# Patient Record
Sex: Male | Born: 1975
Health system: Southern US, Community
[De-identification: ages and names within clinical notes are randomized; demographics above are authoritative.]

## PROBLEM LIST (undated history)

## (undated) DIAGNOSIS — I10 Essential (primary) hypertension: Secondary | ICD-10-CM

## (undated) DIAGNOSIS — C44319 Basal cell carcinoma of skin of other parts of face: Secondary | ICD-10-CM

## (undated) HISTORY — DX: Essential (primary) hypertension: I10

## (undated) HISTORY — DX: Basal cell carcinoma of skin of other parts of face: C44.319

---
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
## 2007-04-14 HISTORY — PX: APPENDECTOMY: SHX54

## 2013-04-12 ENCOUNTER — Ambulatory Visit: Payer: Self-pay | Admitting: Internal Medicine

## 2013-04-14 ENCOUNTER — Encounter: Payer: Self-pay | Admitting: Internal Medicine

## 2013-04-14 ENCOUNTER — Ambulatory Visit: Payer: Self-pay | Admitting: Internal Medicine

## 2013-04-14 ENCOUNTER — Telehealth: Payer: Self-pay

## 2013-04-14 ENCOUNTER — Ambulatory Visit (INDEPENDENT_AMBULATORY_CARE_PROVIDER_SITE_OTHER): Payer: BC Managed Care – PPO | Admitting: Internal Medicine

## 2013-04-14 VITALS — BP 140/88 | HR 61 | Temp 98.1°F | Ht 68.0 in | Wt 185.0 lb

## 2013-04-14 DIAGNOSIS — Z0000 Encounter for general adult medical examination without abnormal findings: Secondary | ICD-10-CM

## 2013-04-14 DIAGNOSIS — G4700 Insomnia, unspecified: Secondary | ICD-10-CM

## 2013-04-14 MED ORDER — ZOLPIDEM TARTRATE 5 MG PO TABS: 5.0000 mg | ORAL_TABLET | Freq: Every evening | ORAL | Status: DC | PRN

## 2013-04-14 NOTE — Telephone Encounter (Signed)
Called in Rx for Ambien as directed by Webb Silversmith

## 2013-04-14 NOTE — Progress Notes (Signed)
Pre-visit discussion using our clinic review tool. No additional management support is needed unless otherwise documented below in the visit note.  

## 2013-04-14 NOTE — Patient Instructions (Signed)
Health Maintenance, Males A healthy lifestyle and preventative care can promote health and wellness.  Maintain regular health, dental, and eye exams.  Eat a healthy diet. Foods like vegetables, fruits, whole grains, low-fat dairy products, and lean protein foods contain the nutrients you need without too many calories. Decrease your intake of foods high in solid fats, added sugars, and salt. Get information about a proper diet from your caregiver, if necessary.  Regular physical exercise is one of the most important things you can do for your health. Most adults should get at least 150 minutes of moderate-intensity exercise (any activity that increases your heart rate and causes you to sweat) each week. In addition, most adults need muscle-strengthening exercises on 2 or more days a week.   Maintain a healthy weight. The body mass index (BMI) is a screening tool to identify possible weight problems. It provides an estimate of body fat based on height and weight. Your caregiver can help determine your BMI, and can help you achieve or maintain a healthy weight. For adults 20 years and older:  A BMI below 18.5 is considered underweight.  A BMI of 18.5 to 24.9 is normal.  A BMI of 25 to 29.9 is considered overweight.  A BMI of 30 and above is considered obese.  Maintain normal blood lipids and cholesterol by exercising and minimizing your intake of saturated fat. Eat a balanced diet with plenty of fruits and vegetables. Blood tests for lipids and cholesterol should begin at age 84 and be repeated every 5 years. If your lipid or cholesterol levels are high, you are over 50, or you are a high risk for heart disease, you may need your cholesterol levels checked more frequently.Ongoing high lipid and cholesterol levels should be treated with medicines, if diet and exercise are not effective.  If you smoke, find out from your caregiver how to quit. If you do not use tobacco, do not start.  Lung  cancer screening is recommended for adults aged 62 80 years who are at high risk for developing lung cancer because of a history of smoking. Yearly low-dose computed tomography (CT) is recommended for people who have at least a 30-pack-year history of smoking and are a current smoker or have quit within the past 15 years. A pack year of smoking is smoking an average of 1 pack of cigarettes a day for 1 year (for example: 1 pack a day for 30 years or 2 packs a day for 15 years). Yearly screening should continue until the smoker has stopped smoking for at least 15 years. Yearly screening should also be stopped for people who develop a health problem that would prevent them from having lung cancer treatment.  If you choose to drink alcohol, do not exceed 2 drinks per day. One drink is considered to be 12 ounces (355 mL) of beer, 5 ounces (148 mL) of wine, or 1.5 ounces (44 mL) of liquor.  Avoid use of street drugs. Do not share needles with anyone. Ask for help if you need support or instructions about stopping the use of drugs.  High blood pressure causes heart disease and increases the risk of stroke. Blood pressure should be checked at least every 1 to 2 years. Ongoing high blood pressure should be treated with medicines if weight loss and exercise are not effective.  If you are 32 to 39 years old, ask your caregiver if you should take aspirin to prevent heart disease.  Diabetes screening involves taking a blood  sample to check your fasting blood sugar level. This should be done once every 3 years, after age 80, if you are within normal weight and without risk factors for diabetes. Testing should be considered at a younger age or be carried out more frequently if you are overweight and have at least 1 risk factor for diabetes.  Colorectal cancer can be detected and often prevented. Most routine colorectal cancer screening begins at the age of 72 and continues through age 62. However, your caregiver may  recommend screening at an earlier age if you have risk factors for colon cancer. On a yearly basis, your caregiver may provide home test kits to check for hidden blood in the stool. Use of a small camera at the end of a tube, to directly examine the colon (sigmoidoscopy or colonoscopy), can detect the earliest forms of colorectal cancer. Talk to your caregiver about this at age 14, when routine screening begins. Direct examination of the colon should be repeated every 5 to 10 years through age 100, unless early forms of pre-cancerous polyps or small growths are found.  Hepatitis C blood testing is recommended for all people born from [birth year] through 1966 and any individual with known risks for hepatitis C.  Healthy men should no longer receive prostate-specific antigen (PSA) blood tests as part of routine cancer screening. Consult with your caregiver about prostate cancer screening.  Testicular cancer screening is not recommended for adolescents or adult males who have no symptoms. Screening includes self-exam, caregiver exam, and other screening tests. Consult with your caregiver about any symptoms you have or any concerns you have about testicular cancer.  Practice safe sex. Use condoms and avoid high-risk sexual practices to reduce the spread of sexually transmitted infections (STIs).  Use sunscreen. Apply sunscreen liberally and repeatedly throughout the day. You should seek shade when your shadow is shorter than you. Protect yourself by wearing long sleeves, pants, a wide-brimmed hat, and sunglasses year round, whenever you are outdoors.  Notify your caregiver of new moles or changes in moles, especially if there is a change in shape or color. Also notify your caregiver if a mole is larger than the size of a pencil eraser.  A one-time screening for abdominal aortic aneurysm (AAA) and surgical repair of large AAAs by sound wave imaging (ultrasonography) is recommended for ages 8 to 68 years who are  current or former smokers.  Stay current with your immunizations. Document Released: 09/26/2007 Document Revised: 07/25/2012 Document Reviewed: 08/25/2010 [facility] Patient Information 2014 [place], [place].

## 2013-04-14 NOTE — Progress Notes (Signed)
HPI  Pt presents to the clinic today to establish care. He recently moved from [place]. He has no concerns today.  Flu: 2014 Tetanus: 2012 Dentist: biannually  Past Medical History  Diagnosis Date  . Hypertension     Current Outpatient Prescriptions  Medication Sig Dispense Refill  . lisinopril-hydrochlorothiazide (PRINZIDE,ZESTORETIC) 20-12.5 MG per tablet Take 1 tablet by mouth daily.      . Multiple Vitamin (MULTIVITAMIN) tablet Take 1 tablet by mouth daily.      Marland Kitchen zolpidem (AMBIEN) 5 MG tablet Take 5 mg by mouth at bedtime as needed for sleep.       No current facility-administered medications for this visit.    No Known Allergies  Family History  Problem Relation Age of Onset  . Hypertension Mother   . Hypertension Father   . Hypertension Maternal Grandmother   . Hypertension Maternal Grandfather   . Cancer Maternal Grandfather   . Hypertension Paternal Grandmother   . Hypertension Paternal Grandfather   . Cancer Paternal Grandfather   . Hypertension Brother     History   Social History  . Marital Status: Married    Spouse Name: N/A    Number of Children: N/A  . Years of Education: N/A   Occupational History  . Not on file.   Social History Main Topics  . Smoking status: Never Smoker   . Smokeless tobacco: Never Used  . Alcohol Use: Yes     Comment: rare occasion  . Drug Use: No  . Sexual Activity: Not on file   Other Topics Concern  . Not on file   Social History Narrative  . No narrative on file    ROS:  Constitutional: Denies fever, malaise, fatigue, headache or abrupt weight changes.  HEENT: Denies eye pain, eye redness, ear pain, ringing in the ears, wax buildup, runny nose, nasal congestion, bloody nose, or sore throat. Respiratory: Denies difficulty breathing, shortness of breath, cough or sputum production.   Cardiovascular: Denies chest pain, chest tightness, palpitations or swelling in the hands or feet.  Gastrointestinal: Denies  abdominal pain, bloating, constipation, diarrhea or blood in the stool.  GU: Denies frequency, urgency, pain with urination, blood in urine, odor or discharge. Musculoskeletal: Denies decrease in range of motion, difficulty with gait, muscle pain or joint pain and swelling.  Skin: Denies redness, rashes, lesions or ulcercations.  Neurological: Denies dizziness, difficulty with memory, difficulty with speech or problems with balance and coordination.   No other specific complaints in a complete review of systems (except as listed in HPI above).  PE:  BP 140/88  Pulse 61  Temp(Src) 98.1 F (36.7 C) (Oral)  Ht 5\' 8"  (1.727 m)  Wt 185 lb (83.915 kg)  BMI 28.14 kg/m2  SpO2 98% Wt Readings from Last 3 Encounters:  04/14/13 185 lb (83.915 kg)    General: Appears his stated age, well developed, well nourished in NAD. HEENT: Head: normal shape and size; Eyes: sclera white, no icterus, conjunctiva pink, PERRLA and EOMs intact; Ears: Tm's gray and intact, normal light reflex; Nose: mucosa pink and moist, septum midline; Throat/Mouth: Teeth present, mucosa pink and moist, no lesions or ulcerations noted.  Neck: Normal range of motion. Neck supple, trachea midline. No massses, lumps or thyromegaly present.  Cardiovascular: Normal rate and rhythm. S1,S2 noted.  No murmur, rubs or gallops noted. No JVD or BLE edema. No carotid bruits noted. Pulmonary/Chest: Normal effort and positive vesicular breath sounds. No respiratory distress. No wheezes, rales or ronchi noted.  Abdomen: Soft and nontender. Normal bowel sounds, no bruits noted. No distention or masses noted. Liver, spleen and kidneys non palpable. Musculoskeletal: Normal range of motion. No signs of joint swelling. No difficulty with gait.  Neurological: Alert and oriented. Cranial nerves II-XII intact. Coordination normal. +DTRs bilaterally. Psychiatric: Mood and affect normal. Behavior is normal. Judgment and thought content normal.       Assessment and Plan:  Preventative Health Maintenance:  Declined screening labs today All HM UTD Refilled ambien per request RTC in 1 year or sooner if needed

## 2013-06-05 ENCOUNTER — Telehealth: Payer: Self-pay | Admitting: Internal Medicine

## 2013-06-05 NOTE — Telephone Encounter (Signed)
Pt wife dropped off [employer] Annual Review forms for Patrick Robertson to complete. Please call [phone] when ready for pick up.

## 2013-06-06 NOTE — Telephone Encounter (Signed)
This form has been placed in your inbox

## 2013-06-07 NOTE — Telephone Encounter (Signed)
Left detailed message on VM letting pt know his form will be placed in front office for pick up

## 2013-06-07 NOTE — Telephone Encounter (Signed)
Form is done and I will put it in your inbox

## 2013-08-29 ENCOUNTER — Other Ambulatory Visit: Payer: Self-pay | Admitting: *Deleted

## 2013-08-29 DIAGNOSIS — G4700 Insomnia, unspecified: Secondary | ICD-10-CM

## 2013-08-29 MED ORDER — ZOLPIDEM TARTRATE 5 MG PO TABS: 5.0000 mg | ORAL_TABLET | Freq: Every evening | ORAL | Status: DC | PRN

## 2013-08-29 NOTE — Telephone Encounter (Signed)
Received faxed refill request from pharmacy. Last refill 04/14/13 #30/2 refills. Is it okay to refill medication?

## 2013-08-29 NOTE — Telephone Encounter (Signed)
Rx called in to pharmacy. 

## 2013-08-29 NOTE — Telephone Encounter (Signed)
Ok to refill ambien

## 2013-11-14 ENCOUNTER — Other Ambulatory Visit: Payer: Self-pay

## 2013-11-14 NOTE — Telephone Encounter (Signed)
Pt left v/m requesting refill lisinopril HCTZ. Webb Silversmith NP has never filled before; pt was seen on 04/14/13 to establish care.Please advise.

## 2013-11-15 MED ORDER — LISINOPRIL-HYDROCHLOROTHIAZIDE 20-12.5 MG PO TABS: 1.0000 | ORAL_TABLET | Freq: Every day | ORAL | Status: DC

## 2013-11-15 NOTE — Telephone Encounter (Signed)
Left message on pt's voicemail that rx was sent in.

## 2014-01-29 ENCOUNTER — Other Ambulatory Visit: Payer: Self-pay | Admitting: Internal Medicine

## 2014-01-30 NOTE — Telephone Encounter (Signed)
Pt est in 1/15, no future appts scheduled, #30x2 written in 5/15, #30 last filled 12/31/13. Ok to refill?

## 2014-04-03 ENCOUNTER — Other Ambulatory Visit: Payer: Self-pay | Admitting: Internal Medicine

## 2014-04-03 NOTE — Telephone Encounter (Signed)
Rx called in as directed.   

## 2014-04-03 NOTE — Telephone Encounter (Signed)
Ok to phone in 30 day supply, will need OV before additional refills given

## 2014-04-03 NOTE — Telephone Encounter (Signed)
Last filled 01/30/14--please advise

## 2014-05-09 ENCOUNTER — Other Ambulatory Visit: Payer: Self-pay | Admitting: Internal Medicine

## 2014-05-09 NOTE — Telephone Encounter (Signed)
Last filled 04/03/2014--please advise

## 2014-05-10 NOTE — Telephone Encounter (Signed)
Ok to phone in [place]

## 2014-05-10 NOTE — Telephone Encounter (Signed)
Rx called in to pharmacy. 

## 2014-05-28 ENCOUNTER — Other Ambulatory Visit: Payer: Self-pay | Admitting: Internal Medicine

## 2014-05-29 NOTE — Telephone Encounter (Signed)
This is a new pt--has not had a f/u with you and this medication has not been filled by you--it looks like--please advise if okay to refill--i attached not stating pt needed to schedule CPE

## 2014-05-30 NOTE — Telephone Encounter (Signed)
Will send in electronically, no refills unless he schedules CPE

## 2014-06-13 ENCOUNTER — Encounter: Payer: BLUE CROSS/BLUE SHIELD | Admitting: Internal Medicine

## 2014-06-25 ENCOUNTER — Ambulatory Visit: Payer: BLUE CROSS/BLUE SHIELD | Admitting: Internal Medicine

## 2014-06-27 ENCOUNTER — Encounter: Payer: Self-pay | Admitting: Internal Medicine

## 2014-06-27 ENCOUNTER — Ambulatory Visit (INDEPENDENT_AMBULATORY_CARE_PROVIDER_SITE_OTHER): Payer: BLUE CROSS/BLUE SHIELD | Admitting: Internal Medicine

## 2014-06-27 VITALS — BP 128/88 | HR 97 | Temp 99.0°F | Ht 67.25 in | Wt 179.0 lb

## 2014-06-27 DIAGNOSIS — G4700 Insomnia, unspecified: Secondary | ICD-10-CM

## 2014-06-27 DIAGNOSIS — I10 Essential (primary) hypertension: Secondary | ICD-10-CM

## 2014-06-27 DIAGNOSIS — N469 Male infertility, unspecified: Secondary | ICD-10-CM

## 2014-06-27 DIAGNOSIS — Z0000 Encounter for general adult medical examination without abnormal findings: Secondary | ICD-10-CM

## 2014-06-27 NOTE — Assessment & Plan Note (Signed)
Continue to take on work nights Will request records from [facility] Medication refilled for request

## 2014-06-27 NOTE — Assessment & Plan Note (Signed)
Had a son 01/2014 He continues with HCG and Testosterone injections Will request records from [facility] u[facility]

## 2014-06-27 NOTE — Assessment & Plan Note (Signed)
Well controlled on Lisinopril-HCT Will have records faxed from [facility] Medication refilled x 1 year

## 2014-06-27 NOTE — Progress Notes (Signed)
Pre visit review using our clinic review tool, if applicable. No additional management support is needed unless otherwise documented below in the visit note. 

## 2014-06-27 NOTE — Patient Instructions (Signed)

## 2014-06-27 NOTE — Progress Notes (Signed)
Subjective:    Patient ID: Patrick Robertson, male    DOB: 1975-09-17, 39 y.o.   MRN: [id number]  HPI  Pt presents to the clinic today for his annual physical.  Flu: 01/2014 Tetanus: 2012 Dentist: Biannually  Diet: His weight is down 6 lbs over the last year. Just watching what he eats, lean meats, more fruits an veggies. Exercise: He is walking 3-4 days a week for about an hour.  HTN: BP well controlled on Lisinopril HCT. BP today 128/88.  Insomnia: Takes Ambien, mainly on work nights 4 nights per week.  Male Infertility. Sees Dr. Michaelle Copas at [facility] urology. He is taking Testosterone injections and HCG injections. He reports he just had a full panel of labs done last month.  Review of Systems  Past Medical History  Diagnosis Date  . Hypertension     Current Outpatient Prescriptions  Medication Sig Dispense Refill  . human chorionic gonadotropin (PREGNYL/NOVAREL) 10000 UNITS injection Inject 3,000 Units into the muscle. 3 times a week  Pt is also taking FSH inj 3 times a week    . lisinopril-hydrochlorothiazide (PRINZIDE,ZESTORETIC) 20-12.5 MG per tablet Take 1 tablet by mouth daily. NEED TO MAKE AN ANNUAL OFFICE VISIT 30 tablet 1  . Multiple Vitamin (MULTIVITAMIN) tablet Take 1 tablet by mouth daily.    Marland Kitchen zolpidem (AMBIEN) 5 MG tablet TAKE 1 TABLET BY MOUTH EVERY NIGHT AT BEDTIME AS NEEDED 30 tablet 0   No current facility-administered medications for this visit.    No Known Allergies  Family History  Problem Relation Age of Onset  . Hypertension Mother   . Hypertension Father   . Hypertension Maternal Grandmother   . Hypertension Maternal Grandfather   . Cancer Maternal Grandfather   . Hypertension Paternal Grandmother   . Hypertension Paternal Grandfather   . Cancer Paternal Grandfather   . Hypertension Brother     History   Social History  . Marital Status: Married    Spouse Name: N/A  . Number of Children: N/A  . Years of Education: N/A   Occupational  History  . Not on file.   Social History Main Topics  . Smoking status: Never Smoker   . Smokeless tobacco: Never Used  . Alcohol Use: 0.6 oz/week    1 Cans of beer per week     Comment: rare occasion  . Drug Use: No  . Sexual Activity: Yes   Other Topics Concern  . Not on file   Social History Narrative     Constitutional: Denies fever, malaise, fatigue, headache or abrupt weight changes.  HEENT: Denies eye pain, eye redness, ear pain, ringing in the ears, wax buildup, runny nose, nasal congestion, bloody nose, or sore throat. Respiratory: Denies difficulty breathing, shortness of breath, cough or sputum production.   Cardiovascular: Denies chest pain, chest tightness, palpitations or swelling in the hands or feet.  Gastrointestinal: Denies abdominal pain, bloating, constipation, diarrhea or blood in the stool.  GU: Denies urgency, frequency, pain with urination, burning sensation, blood in urine, odor or discharge. Musculoskeletal: Denies decrease in range of motion, difficulty with gait, muscle pain or joint pain and swelling.  Skin: Denies redness, rashes, lesions or ulcercations.  Neurological: Denies dizziness, difficulty with memory, difficulty with speech or problems with balance and coordination.  Psych: Denies anxiety, depression, SI/HI.  No other specific complaints in a complete review of systems (except as listed in HPI above).     Objective:   Physical Exam  BP 128/88 mmHg  Pulse 97  Temp(Src) 99 F (37.2 C) (Oral)  Ht 5' 7.25" (1.708 m)  Wt 179 lb (81.194 kg)  BMI 27.83 kg/m2  SpO2 98% Wt Readings from Last 3 Encounters:  06/27/14 179 lb (81.194 kg)  04/14/13 185 lb (83.915 kg)    General: Appears his stated age, well developed, well nourished in NAD. Skin: Warm, dry and intact. No rashes, or ulcerations noted. Scattered seborrheic keratosis noted on lower legs. HEENT: Head: normal shape and size; Eyes: sclera white, no icterus, conjunctiva pink,  PERRLA and EOMs intact; Ears: Tm's gray and intact, normal light reflex; Nose: mucosa pink and moist, septum midline; Throat/Mouth: Teeth present, mucosa pink and moist, no exudate, lesions or ulcerations noted.  Neck:  Neck supple, trachea midline. No masses, lumps or thyromegaly present.  Cardiovascular: Normal rate and rhythm. S1,S2 noted.  No murmur, rubs or gallops noted. No JVD or BLE edema. No carotid bruits noted. Pulmonary/Chest: Normal effort and positive vesicular breath sounds. No respiratory distress. No wheezes, rales or ronchi noted.  Abdomen: Soft and nontender. Normal bowel sounds, no bruits noted. No distention or masses noted. Liver, spleen and kidneys non palpable. Musculoskeletal: Normal range of motion. Strength 5/5 BUE/BLE. No signs of joint swelling. No difficulty with gait.  Neurological: Alert and oriented. Cranial nerves II-XII grossly intact. Coordination normal.  Psychiatric: Mood and affect normal. Behavior is normal. Judgment and thought content normal.         Assessment & Plan:   Preventative Health Maintenance:  All HM UTD Will request labs from [facility] Continue to work on diet and exercise  RTC in 1 year or sooner if needed

## 2014-06-28 ENCOUNTER — Telehealth: Payer: Self-pay | Admitting: Internal Medicine

## 2014-06-28 NOTE — Telephone Encounter (Signed)
emmi emailed °

## 2014-07-04 ENCOUNTER — Other Ambulatory Visit: Payer: Self-pay | Admitting: Internal Medicine

## 2014-07-04 MED ORDER — ZOLPIDEM TARTRATE 5 MG PO TABS: 5.0000 mg | ORAL_TABLET | Freq: Every evening | ORAL | Status: DC | PRN

## 2014-07-04 NOTE — Telephone Encounter (Signed)
Ok to phone in [place]

## 2014-07-04 NOTE — Telephone Encounter (Signed)
Rx called in to pharmacy. 

## 2014-07-04 NOTE — Telephone Encounter (Signed)
Pt left v/m request refill zolpidem to [facility].Please advise. Pt last seen 06/27/14.

## 2014-07-30 ENCOUNTER — Telehealth: Payer: Self-pay

## 2014-07-30 NOTE — Telephone Encounter (Signed)
[name] left v/m requesting cb about getting prior auth to increase quantity of med; did not list name of med requesting increase in quantity; left v/m requesting cb.

## 2014-07-31 NOTE — Telephone Encounter (Signed)
[name] left v/m requesting quantity increase for ambien; ins put quantity limit and if doctor wants to request quantity exception to call [phone].

## 2014-08-03 NOTE — Telephone Encounter (Signed)
Spoke with wife [name] per HIPAA to let her know i will be calling [facility] today to complete PA over the phone and will let her know what happens--if it is approve pt will need a refill  PT ID# [id number] per pharmacy

## 2014-08-06 NOTE — Telephone Encounter (Signed)
Last filled 07/04/14 please advise if okay to refill

## 2014-08-07 MED ORDER — ZOLPIDEM TARTRATE 5 MG PO TABS: 5.0000 mg | ORAL_TABLET | Freq: Every evening | ORAL | Status: DC | PRN

## 2014-08-07 NOTE — Addendum Note (Signed)
Addended by: Lurlean Nanny on: 08/07/2014 08:43 AM   Modules accepted: Orders

## 2014-08-07 NOTE — Telephone Encounter (Signed)
Ok to refill 

## 2014-08-07 NOTE — Telephone Encounter (Signed)
Left message on voicemail Rx called into pharmacy

## 2014-10-21 ENCOUNTER — Other Ambulatory Visit: Payer: Self-pay | Admitting: Internal Medicine

## 2014-10-22 NOTE — Telephone Encounter (Signed)
Last filled 08/07/14--please advise

## 2014-10-22 NOTE — Telephone Encounter (Signed)
Ok to phone in [place]

## 2014-10-23 NOTE — Telephone Encounter (Signed)
Medication phoned to pharmacy.  

## 2014-12-18 ENCOUNTER — Other Ambulatory Visit: Payer: Self-pay | Admitting: Internal Medicine

## 2014-12-18 NOTE — Telephone Encounter (Signed)
Ok to phone in ambien 

## 2014-12-18 NOTE — Telephone Encounter (Signed)
Last filled 10/22/2014--please advise

## 2014-12-18 NOTE — Telephone Encounter (Signed)
Rx called in to pharmacy. 

## 2015-01-02 ENCOUNTER — Ambulatory Visit (INDEPENDENT_AMBULATORY_CARE_PROVIDER_SITE_OTHER): Payer: BLUE CROSS/BLUE SHIELD | Admitting: Internal Medicine

## 2015-01-02 ENCOUNTER — Ambulatory Visit: Payer: BLUE CROSS/BLUE SHIELD | Admitting: Internal Medicine

## 2015-01-02 ENCOUNTER — Encounter: Payer: Self-pay | Admitting: Internal Medicine

## 2015-01-02 VITALS — BP 138/90 | HR 58 | Temp 98.7°F | Wt 184.0 lb

## 2015-01-02 DIAGNOSIS — J069 Acute upper respiratory infection, unspecified: Secondary | ICD-10-CM | POA: Diagnosis not present

## 2015-01-02 MED ORDER — AZITHROMYCIN 250 MG PO TABS: ORAL_TABLET | ORAL | Status: DC

## 2015-01-02 NOTE — Progress Notes (Signed)
Pre visit review using our clinic review tool, if applicable. No additional management support is needed unless otherwise documented below in the visit note. 

## 2015-01-02 NOTE — Patient Instructions (Signed)
Cough, Adult  A cough is a reflex that helps clear your throat and airways. It can help heal the body or may be a reaction to an irritated airway. A cough may only last 2 or 3 weeks (acute) or may last more than 8 weeks (chronic).  CAUSES Acute cough:  Viral or bacterial infections. Chronic cough:  Infections.  Allergies.  Asthma.  Post-nasal drip.  Smoking.  Heartburn or acid reflux.  Some medicines.  Chronic lung problems (COPD).  Cancer. SYMPTOMS   Cough.  Fever.  Chest pain.  Increased breathing rate.  High-pitched whistling sound when breathing (wheezing).  Colored mucus that you cough up (sputum). TREATMENT   A bacterial cough may be treated with antibiotic medicine.  A viral cough must run its course and will not respond to antibiotics.  Your caregiver may recommend other treatments if you have a chronic cough. HOME CARE INSTRUCTIONS   Only take over-the-counter or prescription medicines for pain, discomfort, or fever as directed by your caregiver. Use cough suppressants only as directed by your caregiver.  Use a cold steam vaporizer or humidifier in your bedroom or home to help loosen secretions.  Sleep in a semi-upright position if your cough is worse at night.  Rest as needed.  Stop smoking if you smoke. SEEK IMMEDIATE MEDICAL CARE IF:   You have pus in your sputum.  Your cough starts to worsen.  You cannot control your cough with suppressants and are losing sleep.  You begin coughing up blood.  You have difficulty breathing.  You develop pain which is getting worse or is uncontrolled with medicine.  You have a fever. MAKE SURE YOU:   Understand these instructions.  Will watch your condition.  Will get help right away if you are not doing well or get worse. Document Released: 09/26/2010 Document Revised: 06/22/2011 Document Reviewed: 09/26/2010 [employer] Patient Information 2015 [employer]. This information is not intended  to replace advice given to you by your health care Rondell Pardon. Make sure you discuss any questions you have with your health care Garnell Begeman.  

## 2015-01-02 NOTE — Progress Notes (Signed)
HPI  Pt presents to the clinic today with c/o ear fullness, runny nose, scratchy throat and cough. This started 2 weeks ago. The cough is productive of brown mucous. He denies shortness of breath. He has had low grade fevers but denies chills or body aches.  He has tried Ibuprofen, Tylenol, Vit C and warm salt water gargles with minimal relief. He denies history of allergies or breathing problems. He has had sick contacts..  Review of Systems    Past Medical History  Diagnosis Date  . Hypertension     Family History  Problem Relation Age of Onset  . Hypertension Mother   . Hypertension Father   . Hypertension Maternal Grandmother   . Hypertension Maternal Grandfather   . Cancer Maternal Grandfather   . Hypertension Paternal Grandmother   . Hypertension Paternal Grandfather   . Cancer Paternal Grandfather   . Hypertension Brother     Social History   Social History  . Marital Status: Married    Spouse Name: N/A  . Number of Children: N/A  . Years of Education: N/A   Occupational History  . Not on file.   Social History Main Topics  . Smoking status: Never Smoker   . Smokeless tobacco: Never Used  . Alcohol Use: 0.6 oz/week    1 Cans of beer per week     Comment: rare occasional  . Drug Use: No  . Sexual Activity: Yes   Other Topics Concern  . Not on file   Social History Narrative    No Known Allergies   Constitutional: Positive fever. Denies headache, fatigue or abrupt weight changes.  HEENT:  Positive runny nose and sore throat. Denies eye redness, ear pain, ringing in the ears, wax buildup, or bloody nose. Respiratory: Positive cough. Denies difficulty breathing or shortness of breath.  Cardiovascular: Denies chest pain, chest tightness, palpitations or swelling in the hands or feet.   No other specific complaints in a complete review of systems (except as listed in HPI above).  Objective:  BP 138/90 mmHg  Pulse 58  Temp(Src) 98.7 F (37.1 C) (Oral)   Wt 184 lb (83.462 kg)  SpO2 99%   General: Appears his stated age, in NAD. HEENT: Head: normal shape and size, no sinus tenderness noted; Eyes: sclera white, no icterus, conjunctiva pink; Ears: Tm's pink but intact, normal light reflex, + effusions bilaterally; Nose: mucosa pink but and moist, septum midline; Throat/Mouth: + PND. Teeth present, mucosa pink and moist, no exudate noted, no lesions or ulcerations noted.  Neck:  No cervical adenopathy noted. Cardiovascular: Normal rate and rhythm. S1,S2 noted.  No murmur, rubs or gallops noted.  Pulmonary/Chest: Normal effort and positive vesicular breath sounds. No respiratory distress. No wheezes, rales or ronchi noted.      Assessment & Plan:   Upper respiratory infection:  Get some rest and drink plenty of water Continue Ibuprofen and salt water gargles eRx for Azithromax 250 mg x 5 days He declines RX for cough syrup  RTC as needed or if symptoms persist.

## 2015-02-05 ENCOUNTER — Other Ambulatory Visit: Payer: Self-pay | Admitting: Internal Medicine

## 2015-02-05 NOTE — Telephone Encounter (Signed)
Ok to phone in ambien 

## 2015-02-05 NOTE — Telephone Encounter (Signed)
Last filled 12/18/2014--please advise

## 2015-02-06 NOTE — Telephone Encounter (Signed)
Rx called in to pharmacy. 

## 2015-04-04 ENCOUNTER — Other Ambulatory Visit: Payer: Self-pay | Admitting: Internal Medicine

## 2015-04-04 NOTE — Telephone Encounter (Signed)
Ok to phone in ambien 

## 2015-04-04 NOTE — Telephone Encounter (Signed)
Rx called in to pharmacy. 

## 2015-04-04 NOTE — Telephone Encounter (Signed)
Last filled 02/05/2015--please advise

## 2015-05-17 ENCOUNTER — Telehealth: Payer: Self-pay

## 2015-05-17 NOTE — Telephone Encounter (Signed)
Left voice mail message regarding flu shot

## 2015-05-30 ENCOUNTER — Other Ambulatory Visit: Payer: Self-pay | Admitting: Internal Medicine

## 2015-05-31 NOTE — Telephone Encounter (Signed)
Rx called in to pharmacy. 

## 2015-05-31 NOTE — Telephone Encounter (Signed)
Last filled 04/04/2015--please advise

## 2015-05-31 NOTE — Telephone Encounter (Signed)
Ok to phone in Ambien 

## 2015-06-07 ENCOUNTER — Telehealth: Payer: Self-pay | Admitting: Internal Medicine

## 2015-06-07 NOTE — Telephone Encounter (Signed)
Done, in my outbox

## 2015-06-07 NOTE — Telephone Encounter (Signed)
Spouse dropped off provider attestation form to be signed In [facility] IN BOX

## 2015-06-07 NOTE — Telephone Encounter (Signed)
Patient notified form is ready for pick up. °

## 2015-07-26 DIAGNOSIS — Z3141 Encounter for fertility testing: Secondary | ICD-10-CM | POA: Diagnosis not present

## 2015-08-04 ENCOUNTER — Other Ambulatory Visit: Payer: Self-pay | Admitting: Internal Medicine

## 2015-08-05 NOTE — Telephone Encounter (Signed)
Last filled 2/17/201--please advise

## 2015-08-05 NOTE — Telephone Encounter (Signed)
Ok to phone in Ambien 

## 2015-08-05 NOTE — Telephone Encounter (Signed)
Rx called in to pharmacy. 

## 2015-08-23 ENCOUNTER — Other Ambulatory Visit: Payer: Self-pay | Admitting: Internal Medicine

## 2015-09-24 ENCOUNTER — Other Ambulatory Visit: Payer: Self-pay | Admitting: Internal Medicine

## 2015-09-25 NOTE — Telephone Encounter (Signed)
Last f/u 06/2014 

## 2015-09-26 NOTE — Telephone Encounter (Signed)
He needs an appt 

## 2015-10-04 ENCOUNTER — Encounter: Payer: Self-pay | Admitting: Internal Medicine

## 2015-10-04 ENCOUNTER — Ambulatory Visit (INDEPENDENT_AMBULATORY_CARE_PROVIDER_SITE_OTHER): Payer: BLUE CROSS/BLUE SHIELD | Admitting: Internal Medicine

## 2015-10-04 VITALS — BP 130/88 | HR 62 | Temp 99.0°F | Ht 67.0 in | Wt 177.8 lb

## 2015-10-04 DIAGNOSIS — H6981 Other specified disorders of Eustachian tube, right ear: Secondary | ICD-10-CM

## 2015-10-04 DIAGNOSIS — I10 Essential (primary) hypertension: Secondary | ICD-10-CM

## 2015-10-04 DIAGNOSIS — N469 Male infertility, unspecified: Secondary | ICD-10-CM | POA: Diagnosis not present

## 2015-10-04 DIAGNOSIS — R6889 Other general symptoms and signs: Secondary | ICD-10-CM

## 2015-10-04 DIAGNOSIS — G4700 Insomnia, unspecified: Secondary | ICD-10-CM

## 2015-10-04 DIAGNOSIS — Z0001 Encounter for general adult medical examination with abnormal findings: Secondary | ICD-10-CM | POA: Diagnosis not present

## 2015-10-04 DIAGNOSIS — L255 Unspecified contact dermatitis due to plants, except food: Secondary | ICD-10-CM

## 2015-10-04 LAB — CBC
HCT: 45 % (ref 39.0–52.0)
Hemoglobin: 15.3 g/dL (ref 13.0–17.0)
MCHC: 33.9 g/dL (ref 30.0–36.0)
MCV: 87 fl (ref 78.0–100.0)
Platelets: 245 10*3/uL (ref 150.0–400.0)
RBC: 5.17 Mil/uL (ref 4.22–5.81)
RDW: 12.8 % (ref 11.5–15.5)
WBC: 6.4 10*3/uL (ref 4.0–10.5)

## 2015-10-04 LAB — COMPREHENSIVE METABOLIC PANEL
ALBUMIN: 4.6 g/dL (ref 3.5–5.2)
ALT: 27 U/L (ref 0–53)
AST: 28 U/L (ref 0–37)
Alkaline Phosphatase: 56 U/L (ref 39–117)
BUN: 15 mg/dL (ref 6–23)
CHLORIDE: 103 meq/L (ref 96–112)
CO2: 33 mEq/L — ABNORMAL HIGH (ref 19–32)
CREATININE: 1.07 mg/dL (ref 0.40–1.50)
Calcium: 9.8 mg/dL (ref 8.4–10.5)
GFR: 80.87 mL/min (ref >60.00)
Glucose, Bld: 89 mg/dL (ref 70–99)
Potassium: 3.9 mEq/L (ref 3.5–5.1)
SODIUM: 141 meq/L (ref 135–145)
TOTAL PROTEIN: 7.2 g/dL (ref 6.0–8.3)
Total Bilirubin: 1.1 mg/dL (ref 0.2–1.2)

## 2015-10-04 LAB — LIPID PANEL
CHOLESTEROL: 128 mg/dL (ref 0–200)
HDL: 24.8 mg/dL — ABNORMAL LOW (ref >39.00)
LDL CALC: 69 mg/dL (ref 0–99)
NonHDL: 103.16
Total CHOL/HDL Ratio: 5
Triglycerides: 169 mg/dL — ABNORMAL HIGH (ref 0.0–149.0)
VLDL: 33.8 mg/dL (ref 0.0–40.0)

## 2015-10-04 NOTE — Progress Notes (Signed)
Pre visit review using our clinic review tool, if applicable. No additional management support is needed unless otherwise documented below in the visit note. 

## 2015-10-04 NOTE — Addendum Note (Signed)
Addended by: Marchia Bond on: 10/04/2015 03:45 PM   Modules accepted: Miquel Dunn

## 2015-10-04 NOTE — Progress Notes (Signed)
Subjective:    Patient ID: Patrick Robertson, male    DOB: 1975-07-25, 40 y.o.   MRN: [id number]  HPI  Pt presents to the clinic today for his annual exam. He is also due for follow up of chronic conditions.  HTN: BP well controlled on Lisinopril HCT. BP today 130/88. There is no ECG on file. He does request a refill today.  Insomnia: Takes Ambien, mainly on work nights 4 nights per week. He does request a refill today.  Male Infertility. Sees Dr. Michaelle Copas at [facility] urology. He is taking Testosterone injections and HCG injections.   Flu: 01/2015 Tetanus: 2012 Vision Screening: yearly Dentist: annually  Diet: He does eat meat. He consumes fruits and veggies daily. He tries to avoid fried foods. He drinks mostly water. Exercise: He is doing insanity at least 4 days per week.  Review of Systems  Past Medical History  Diagnosis Date  . Hypertension     Current Outpatient Prescriptions  Medication Sig Dispense Refill  . human chorionic gonadotropin (PREGNYL/NOVAREL) 10000 UNITS injection Inject 3,000 Units into the muscle. 3 times a week  Pt is also taking FSH inj 3 times a week    . lisinopril-hydrochlorothiazide (PRINZIDE,ZESTORETIC) 20-12.5 MG tablet Take 1 tablet by mouth daily. MUST SCHEDULE ANNUAL EXAM FOR FURTHER REFILLS 90 tablet 0  . Multiple Vitamin (MULTIVITAMIN) tablet Take 1 tablet by mouth daily.    Marland Kitchen testosterone enanthate (DELATESTRYL) 200 MG/ML injection Inject 100 mg into the muscle.    . zolpidem (AMBIEN) 5 MG tablet TAKE 1 TABLET BY MOUTH AT BEDTIME AS NEEDED 30 tablet 0   No current facility-administered medications for this visit.    No Known Allergies  Family History  Problem Relation Age of Onset  . Hypertension Mother   . Hypertension Father   . Hypertension Maternal Grandmother   . Hypertension Maternal Grandfather   . Cancer Maternal Grandfather   . Hypertension Paternal Grandmother   . Hypertension Paternal Grandfather   . Cancer Paternal  Grandfather   . Hypertension Brother     Social History   Social History  . Marital Status: Married    Spouse Name: N/A  . Number of Children: N/A  . Years of Education: N/A   Occupational History  . Not on file.   Social History Main Topics  . Smoking status: Never Smoker   . Smokeless tobacco: Never Used  . Alcohol Use: No  . Drug Use: No  . Sexual Activity: Yes   Other Topics Concern  . Not on file   Social History Narrative     Constitutional: Denies fever, malaise, fatigue, headache or abrupt weight changes.  HEENT: Pt reports right ear fullness. Denies eye pain, eye redness, ear pain, ringing in the ears, wax buildup, runny nose, nasal congestion, bloody nose, or sore throat. Respiratory: Denies difficulty breathing, shortness of breath, cough or sputum production.   Cardiovascular: Denies chest pain, chest tightness, palpitations or swelling in the hands or feet.  Gastrointestinal: Denies abdominal pain, bloating, constipation, diarrhea or blood in the stool.  GU: Denies urgency, frequency, pain with urination, burning sensation, blood in urine, odor or discharge. Musculoskeletal: Denies decrease in range of motion, difficulty with gait, muscle pain or joint pain and swelling.  Skin: Pt reports rash on abdomen. Denies ulcercations.  Neurological: Denies dizziness, difficulty with memory, difficulty with speech or problems with balance and coordination.  Psych: Denies anxiety, depression, SI/HI.  No other specific complaints in a complete review of systems (  except as listed in HPI above).     Objective:   Physical Exam BP 130/88 mmHg  Pulse 62  Temp(Src) 99 F (37.2 C) (Oral)  Ht 5\' 7"  (1.702 m)  Wt 177 lb 12 oz (80.627 kg)  BMI 27.83 kg/m2  SpO2 98% Wt Readings from Last 3 Encounters:  10/04/15 177 lb 12 oz (80.627 kg)  01/02/15 184 lb (83.462 kg)  06/27/14 179 lb (81.194 kg)    General: Appears his stated age, well developed, well nourished in  NAD. Skin: Warm, dry and intact. Scattered vesicular lesions on erythematous base, noted on abdomen.  HEENT: Head: normal shape and size; Eyes: sclera white, no icterus, conjunctiva pink, PERRLA and EOMs intact; Ears: Tm's gray and intact, normal light reflex, + effusion on the right;  Throat/Mouth: Teeth present, mucosa pink and moist, no exudate, lesions or ulcerations noted.  Neck:  Neck supple, trachea midline. No masses, lumps or thyromegaly present.  Cardiovascular: Normal rate and rhythm. S1,S2 noted.  No murmur, rubs or gallops noted. No JVD or BLE edema.  Pulmonary/Chest: Normal effort and positive vesicular breath sounds. No respiratory distress. No wheezes, rales or ronchi noted.  Abdomen: Soft and nontender. Normal bowel sounds. No distention or masses noted. Liver, spleen and kidneys non palpable. Musculoskeletal: Strength 5/5 BUE/BLE. No signs of joint swelling. No difficulty with gait.  Neurological: Alert and oriented. Cranial nerves II-XII grossly intact. Coordination normal.  Psychiatric: Mood and affect normal. Behavior is normal. Judgment and thought content normal.       Assessment & Plan:   Preventative Health Maintenance:  Flu and Tetanus UTD Encouraged him to continue healthy diet and exercise Advised him to see an eye doctor and dentist annually Will check CBC, CMET and Lipid Profile today  ETD, right:  Start Flonase OTC, BID x 1 week  Contact dermatitis due to plant:  Apply Hydrocortisone cream BID  RTC in 1 year, sooner if needed

## 2015-10-04 NOTE — Assessment & Plan Note (Signed)
Continue Ambien prn  

## 2015-10-04 NOTE — Addendum Note (Signed)
Addended by: Marchia Bond on: 10/04/2015 03:02 PM   Modules accepted: Orders

## 2015-10-04 NOTE — Assessment & Plan Note (Signed)
He will continue to follow with [facility] urology

## 2015-10-04 NOTE — Patient Instructions (Signed)

## 2015-10-04 NOTE — Assessment & Plan Note (Signed)
Controlled on Lisinopril HCT Refilled today

## 2015-10-07 ENCOUNTER — Other Ambulatory Visit: Payer: Self-pay | Admitting: Internal Medicine

## 2015-10-07 ENCOUNTER — Other Ambulatory Visit: Payer: Self-pay

## 2015-10-07 MED ORDER — LISINOPRIL-HYDROCHLOROTHIAZIDE 20-12.5 MG PO TABS: 1.0000 | ORAL_TABLET | Freq: Every day | ORAL | Status: DC

## 2015-10-07 MED ORDER — ZOLPIDEM TARTRATE 5 MG PO TABS: 5.0000 mg | ORAL_TABLET | Freq: Every evening | ORAL | Status: DC | PRN

## 2015-10-07 NOTE — Telephone Encounter (Signed)
Pt left v/m; pt seen 10/04/15 for annual and thought Lorrin Mais was being refilled to [facility](last refilled # 30 on 08/05/15) and lisinopril HCTZ to [facility] [address].(refill for lisinopril HCTZ done per protocol.) pt request cb when ambien refilled.

## 2015-10-07 NOTE — Telephone Encounter (Signed)
Ok to phone in [place], Mel- didn't you do this?

## 2015-10-07 NOTE — Telephone Encounter (Signed)
Rx called to pharmacy as instructed and was advised that refill had not been called in earlier.

## 2015-10-21 ENCOUNTER — Telehealth: Payer: Self-pay

## 2015-10-21 ENCOUNTER — Other Ambulatory Visit: Payer: Self-pay | Admitting: Internal Medicine

## 2015-10-21 MED ORDER — PREDNISONE 10 MG PO TABS: ORAL_TABLET | ORAL | Status: DC

## 2015-10-21 NOTE — Telephone Encounter (Signed)
RX for Prednisone taper sent to pharmacy

## 2015-10-21 NOTE — Telephone Encounter (Signed)
Pt left v/m; pt seen 10/04/15 with ear discomfort; pt continues with ear sounding muffled,pressure in ear with clear drainage from ear and pt has moderate discomfort; flonase is not helping and pt request different med to [facility] . Pt request cb.

## 2015-11-30 ENCOUNTER — Other Ambulatory Visit: Payer: Self-pay | Admitting: Internal Medicine

## 2015-12-03 NOTE — Telephone Encounter (Signed)
Last filled 10/07/15--last OV was CPE 09/2015.Marland KitchenMarland Kitchenplease advise

## 2015-12-04 NOTE — Telephone Encounter (Signed)
Ok to phone in Ambien 

## 2015-12-04 NOTE — Telephone Encounter (Signed)
Rx called in to pharmacy. 

## 2015-12-11 ENCOUNTER — Encounter: Payer: Self-pay | Admitting: Internal Medicine

## 2015-12-11 ENCOUNTER — Ambulatory Visit (INDEPENDENT_AMBULATORY_CARE_PROVIDER_SITE_OTHER): Payer: BLUE CROSS/BLUE SHIELD | Admitting: Internal Medicine

## 2015-12-11 ENCOUNTER — Telehealth: Payer: Self-pay | Admitting: Internal Medicine

## 2015-12-11 VITALS — BP 134/84 | HR 57 | Temp 98.0°F | Wt 182.0 lb

## 2015-12-11 DIAGNOSIS — L989 Disorder of the skin and subcutaneous tissue, unspecified: Secondary | ICD-10-CM | POA: Diagnosis not present

## 2015-12-11 NOTE — Telephone Encounter (Signed)
error 

## 2015-12-11 NOTE — Patient Instructions (Signed)
Skin Biopsy WHY AM I HAVING THIS TEST? This test examines skin tissue under a microscope. Your health care provider may perform this test to identify the source of a skin rash or lesion if an allergy is suspected. In this test, a tissue sample (biopsy) is taken to look at your skin's anatomy and to see if certain proteins and antibodies are present. WHAT KIND OF SAMPLE IS TAKEN? A small sample of skin is required for this test. It is usually collected by numbing an area of skin and removing a small circle of skin. HOW DO I PREPARE FOR THE TEST? There is no preparation required for this test. HOW ARE THE TEST RESULTS REPORTED? Your results may be reported in different ways and are dependent upon the kind of test that is performed. It is your responsibility to obtain your test results. Ask the lab or department performing the test when and how you will get your results. It is most common for your results to be reported as:  Normal skin anatomy (histology).  No evidence of IgG, IgA, or IgM antibody, complement C3, or fibrinogen. WHAT DO THE RESULTS MEAN? Abnormal findings may indicate certain autoimmune diseases. This test can produce many different results. It is important to talk with your health care provider to discuss your results, treatment options, and if necessary, the need for more tests. Talk with your health care provider if you have any questions about your results.   This information is not intended to replace advice given to you by your health care provider. Make sure you discuss any questions you have with your health care provider.   Document Released: 05/01/2004 Document Revised: 04/20/2014 Document Reviewed: 06/27/2014 Elsevier Interactive Patient Education [employer].

## 2015-12-11 NOTE — Progress Notes (Signed)
Subjective:    Patient ID: Patrick Robertson, male    DOB: Aug 13, 1975, 40 y.o.   MRN: [id number]  HPI  Pt presents to the clinic today with c/o a skin lesion on his face. It has been there for about 1-2 years but he reports it has changed in the last 6 weeks. It is rough and feels like a scab. He has also noticed some clear drainage from it. He has not tried anything OTC for this.  Review of Systems      Past Medical History:  Diagnosis Date  . Hypertension     Current Outpatient Prescriptions  Medication Sig Dispense Refill  . lisinopril-hydrochlorothiazide (PRINZIDE,ZESTORETIC) 20-12.5 MG tablet Take 1 tablet by mouth daily. 90 tablet 3  . Multiple Vitamin (MULTIVITAMIN) tablet Take 1 tablet by mouth daily.    Marland Kitchen testosterone enanthate (DELATESTRYL) 200 MG/ML injection Inject 100 mg into the muscle.    . zolpidem (AMBIEN) 5 MG tablet TAKE 1 TABLET BY MOUTH AT BEDTIME AS NEEDED 30 tablet 0   No current facility-administered medications for this visit.     No Known Allergies  Family History  Problem Relation Age of Onset  . Hypertension Mother   . Hypertension Father   . Hypertension Maternal Grandmother   . Hypertension Maternal Grandfather   . Cancer Maternal Grandfather   . Hypertension Paternal Grandmother   . Hypertension Paternal Grandfather   . Cancer Paternal Grandfather   . Hypertension Brother     Social History   Social History  . Marital status: Married    Spouse name: N/A  . Number of children: N/A  . Years of education: N/A   Occupational History  . Not on file.   Social History Main Topics  . Smoking status: Never Smoker  . Smokeless tobacco: Never Used  . Alcohol use No  . Drug use: No  . Sexual activity: Yes   Other Topics Concern  . Not on file   Social History Narrative  . No narrative on file     Constitutional: Denies fever, malaise, fatigue, headache or abrupt weight changes.  Skin: Pt reports skin lesion of face. Denies  redness, rashes, or ulcercations.    No other specific complaints in a complete review of systems (except as listed in HPI above).  Objective:   Physical Exam   BP 134/84   Pulse (!) 57   Temp 98 F (36.7 C) (Oral)   Wt 182 lb (82.6 kg)   SpO2 98%   BMI 28.51 kg/m  Wt Readings from Last 3 Encounters:  12/11/15 182 lb (82.6 kg)  10/04/15 177 lb 12 oz (80.6 kg)  01/02/15 184 lb (83.5 kg)    General: Appears his stated age, well developed, well nourished in NAD. Skin: 0.5 cm round scabbed lesion noted on left chin. No drainage noted.  BMET    Component Value Date/Time   NA 141 10/04/2015 1505   K 3.9 10/04/2015 1505   CL 103 10/04/2015 1505   CO2 33 (H) 10/04/2015 1505   GLUCOSE 89 10/04/2015 1505   BUN 15 10/04/2015 1505   CREATININE 1.07 10/04/2015 1505   CALCIUM 9.8 10/04/2015 1505    Lipid Panel     Component Value Date/Time   CHOL 128 10/04/2015 1505   TRIG 169.0 (H) 10/04/2015 1505   HDL 24.80 (L) 10/04/2015 1505   CHOLHDL 5 10/04/2015 1505   VLDL 33.8 10/04/2015 1505   LDLCALC 69 10/04/2015 1505    CBC  Component Value Date/Time   WBC 6.4 10/04/2015 1505   RBC 5.17 10/04/2015 1505   HGB 15.3 10/04/2015 1505   HCT 45.0 10/04/2015 1505   PLT 245.0 10/04/2015 1505   MCV 87.0 10/04/2015 1505   MCHC 33.9 10/04/2015 1505   RDW 12.8 10/04/2015 1505    Hgb A1C No results found for: HGBA1C         Assessment & Plan:   Skin lesion of face:  Concerning that it has not healed in the last 6 weeks Use Neosporin BID until you see dermatology Referral placed to dermatology for further evaluation  RTC as needed Webb Silversmith, NP

## 2015-12-31 DIAGNOSIS — L821 Other seborrheic keratosis: Secondary | ICD-10-CM | POA: Diagnosis not present

## 2015-12-31 DIAGNOSIS — C44319 Basal cell carcinoma of skin of other parts of face: Secondary | ICD-10-CM | POA: Diagnosis not present

## 2015-12-31 DIAGNOSIS — L918 Other hypertrophic disorders of the skin: Secondary | ICD-10-CM | POA: Diagnosis not present

## 2016-02-17 ENCOUNTER — Other Ambulatory Visit: Payer: Self-pay | Admitting: Internal Medicine

## 2016-02-18 NOTE — Telephone Encounter (Signed)
Last filled 12/04/15--please advise

## 2016-02-19 NOTE — Telephone Encounter (Signed)
Ok to phone in Ambien  *please change class to "phone in" 

## 2016-02-20 DIAGNOSIS — E291 Testicular hypofunction: Secondary | ICD-10-CM | POA: Diagnosis not present

## 2016-02-20 NOTE — Telephone Encounter (Signed)
Rx called in to pharmacy. 

## 2016-05-14 HISTORY — PX: MOHS SURGERY: SHX181

## 2016-05-20 DIAGNOSIS — C44319 Basal cell carcinoma of skin of other parts of face: Secondary | ICD-10-CM | POA: Diagnosis not present

## 2016-06-04 ENCOUNTER — Other Ambulatory Visit: Payer: Self-pay | Admitting: Internal Medicine

## 2016-06-04 NOTE — Telephone Encounter (Signed)
Rx called in to pharmacy. 

## 2016-06-04 NOTE — Telephone Encounter (Signed)
Last filled 02/20/16--please advise

## 2016-06-04 NOTE — Telephone Encounter (Signed)
Ok to phone in Ambien 

## 2016-08-12 ENCOUNTER — Encounter: Payer: BLUE CROSS/BLUE SHIELD | Admitting: Internal Medicine

## 2016-08-27 ENCOUNTER — Other Ambulatory Visit: Payer: Self-pay | Admitting: Internal Medicine

## 2016-08-28 NOTE — Telephone Encounter (Signed)
Last filled 06/04/2016... Please advise

## 2016-08-28 NOTE — Telephone Encounter (Signed)
Ok to phone in Ambien 

## 2016-08-31 NOTE — Telephone Encounter (Signed)
Rx called in to pharmacy. 

## 2016-10-07 ENCOUNTER — Ambulatory Visit (INDEPENDENT_AMBULATORY_CARE_PROVIDER_SITE_OTHER): Payer: BLUE CROSS/BLUE SHIELD | Admitting: Internal Medicine

## 2016-10-07 ENCOUNTER — Encounter: Payer: Self-pay | Admitting: Internal Medicine

## 2016-10-07 VITALS — BP 130/88 | HR 73 | Temp 98.2°F | Ht 67.25 in | Wt 181.2 lb

## 2016-10-07 DIAGNOSIS — R7989 Other specified abnormal findings of blood chemistry: Secondary | ICD-10-CM

## 2016-10-07 DIAGNOSIS — F5102 Adjustment insomnia: Secondary | ICD-10-CM | POA: Diagnosis not present

## 2016-10-07 DIAGNOSIS — Z0000 Encounter for general adult medical examination without abnormal findings: Secondary | ICD-10-CM

## 2016-10-07 DIAGNOSIS — N469 Male infertility, unspecified: Secondary | ICD-10-CM | POA: Diagnosis not present

## 2016-10-07 DIAGNOSIS — I10 Essential (primary) hypertension: Secondary | ICD-10-CM | POA: Diagnosis not present

## 2016-10-07 LAB — COMPREHENSIVE METABOLIC PANEL
ALBUMIN: 5 g/dL (ref 3.5–5.2)
ALT: 30 U/L (ref 0–53)
AST: 28 U/L (ref 0–37)
Alkaline Phosphatase: 64 U/L (ref 39–117)
BUN: 14 mg/dL (ref 6–23)
CHLORIDE: 99 meq/L (ref 96–112)
CO2: 35 meq/L — AB (ref 19–32)
CREATININE: 1.1 mg/dL (ref 0.40–1.50)
Calcium: 10 mg/dL (ref 8.4–10.5)
GFR: 77.95 mL/min (ref >60.00)
Glucose, Bld: 91 mg/dL (ref 70–99)
Potassium: 4 mEq/L (ref 3.5–5.1)
SODIUM: 139 meq/L (ref 135–145)
Total Bilirubin: 1.5 mg/dL — ABNORMAL HIGH (ref 0.2–1.2)
Total Protein: 7.6 g/dL (ref 6.0–8.3)

## 2016-10-07 LAB — CBC
HCT: 50.1 % (ref 39.0–52.0)
Hemoglobin: 17.9 g/dL — ABNORMAL HIGH (ref 13.0–17.0)
MCHC: 35.7 g/dL (ref 30.0–36.0)
MCV: 84.9 fl (ref 78.0–100.0)
Platelets: 274 10*3/uL (ref 150.0–400.0)
RBC: 5.91 Mil/uL — AB (ref 4.22–5.81)
RDW: 12.9 % (ref 11.5–15.5)
WBC: 7.2 10*3/uL (ref 4.0–10.5)

## 2016-10-07 LAB — LIPID PANEL
CHOL/HDL RATIO: 6
Cholesterol: 131 mg/dL (ref 0–200)
HDL: 20.8 mg/dL — ABNORMAL LOW (ref >39.00)
NONHDL: 110.65
Triglycerides: 251 mg/dL — ABNORMAL HIGH (ref 0.0–149.0)
VLDL: 50.2 mg/dL — ABNORMAL HIGH (ref 0.0–40.0)

## 2016-10-07 LAB — LDL CHOLESTEROL, DIRECT: Direct LDL: 83 mg/dL

## 2016-10-07 MED ORDER — LISINOPRIL-HYDROCHLOROTHIAZIDE 20-12.5 MG PO TABS: 1.0000 | ORAL_TABLET | Freq: Every day | ORAL | 3 refills | Status: DC

## 2016-10-07 MED ORDER — TESTOSTERONE ENANTHATE 200 MG/ML IM SOLN: 100.0000 mg | INTRAMUSCULAR | 5 refills | Status: DC

## 2016-10-07 NOTE — Assessment & Plan Note (Signed)
Controlled on Lisinopril HCT CMET today Medication refilled per request 

## 2016-10-07 NOTE — Patient Instructions (Signed)

## 2016-10-07 NOTE — Assessment & Plan Note (Signed)
Testosterone refilled today Will check yearly PSA, CBC, free and total testosterone levels

## 2016-10-07 NOTE — Assessment & Plan Note (Signed)
Continue Ambien as needed No refill needed today

## 2016-10-07 NOTE — Progress Notes (Signed)
Subjective:    Patient ID: Patrick Robertson, male    DOB: 11/27/1975, 41 y.o.   MRN: [id number]  HPI  Pt presents to the clinic today for his annual exam. He is also due to follow up chronic conditions.  HTN: His BP today is 130/88. He is taking Lisinopril-HCT as prescribed. There is no ECG on file.  Insomnia: He takes Ambien on the nights he works 3rd shift. He does not need a refill of this today.  Male Infertility: He follows with Dr. Michaelle Copas at [facility]. He is taking Testosterone injections. He would like to know if I could start prescribing his testosterone so that he do not have to follow with Dr. Michaelle Copas..   Flu: 102/107 Tetanus: 04/2010 Vision Screening: yearly Dentist: biannually  Diet: He does eat meat. He consumes fruits and veggies daily. He occasssionally eats fried foods. He drinks mostly water. Exercise: None  Review of Systems  Past Medical History:  Diagnosis Date  . Hypertension     Current Outpatient Prescriptions  Medication Sig Dispense Refill  . lisinopril-hydrochlorothiazide (PRINZIDE,ZESTORETIC) 20-12.5 MG tablet Take 1 tablet by mouth daily. 90 tablet 3  . Multiple Vitamin (MULTIVITAMIN) tablet Take 1 tablet by mouth daily.    Marland Kitchen testosterone enanthate (DELATESTRYL) 200 MG/ML injection Inject 100 mg into the muscle.    . zolpidem (AMBIEN) 5 MG tablet TAKE 1 TABLET BY MOUTH AT BEDTIME AS NEEDED 30 tablet 0   No current facility-administered medications for this visit.     No Known Allergies  Family History  Problem Relation Age of Onset  . Hypertension Mother   . Hypertension Father   . Hypertension Maternal Grandmother   . Hypertension Maternal Grandfather   . Cancer Maternal Grandfather   . Hypertension Paternal Grandmother   . Hypertension Paternal Grandfather   . Cancer Paternal Grandfather   . Hypertension Brother     Social History   Social History  . Marital status: Married    Spouse name: N/A  . Number of children: N/A  . Years of  education: N/A   Occupational History  . Not on file.   Social History Main Topics  . Smoking status: Never Smoker  . Smokeless tobacco: Never Used  . Alcohol use No  . Drug use: No  . Sexual activity: Yes   Other Topics Concern  . Not on file   Social History Narrative  . No narrative on file     Constitutional: Denies fever, malaise, fatigue, headache or abrupt weight changes.  HEENT: Denies eye pain, eye redness, ear pain, ringing in the ears, wax buildup, runny nose, nasal congestion, bloody nose, or sore throat. Respiratory: Denies difficulty breathing, shortness of breath, cough or sputum production.   Cardiovascular: Denies chest pain, chest tightness, palpitations or swelling in the hands or feet.  Gastrointestinal: Denies abdominal pain, bloating, constipation, diarrhea or blood in the stool.  GU: Denies urgency, frequency, pain with urination, burning sensation, blood in urine, odor or discharge. Musculoskeletal: Denies decrease in range of motion, difficulty with gait, muscle pain or joint pain and swelling.  Skin: Denies redness, rashes, lesions or ulcercations.  Neurological: Denies dizziness, difficulty with memory, difficulty with speech or problems with balance and coordination.  Psych: Denies anxiety, depression, SI/HI.  No other specific complaints in a complete review of systems (except as listed in HPI above).     Objective:   Physical Exam   BP 130/88   Pulse 73   Temp 98.2 F (36.8 C) (  Oral)   Ht 5' 7.25" (1.708 m)   Wt 181 lb 4 oz (82.2 kg)   SpO2 98%   BMI 28.18 kg/m  Wt Readings from Last 3 Encounters:  10/07/16 181 lb 4 oz (82.2 kg)  12/11/15 182 lb (82.6 kg)  10/04/15 177 lb 12 oz (80.6 kg)    General: Appears his stated age, well developed, well nourished in NAD. Skin: Warm, dry and intact.  HEENT: Head: normal shape and size; Eyes: sclera white, no icterus, conjunctiva pink, PERRLA and EOMs intact; Ears: Tm's gray and intact, normal  light reflex; Throat/Mouth: Teeth present, mucosa pink and moist, no exudate, lesions or ulcerations noted.  Neck:  Neck supple, trachea midline. No masses, lumps or thyromegaly present.  Cardiovascular: Normal rate and rhythm. S1,S2 noted.  No murmur, rubs or gallops noted. No JVD or BLE edema.  Pulmonary/Chest: Normal effort and positive vesicular breath sounds. No respiratory distress. No wheezes, rales or ronchi noted.  Abdomen: Soft and nontender. Normal bowel sounds. No distention or masses noted. Liver, spleen and kidneys non palpable. Musculoskeletal: Strength 5/5 BUE/BLE. No difficulty with gait.  Neurological: Alert and oriented. Cranial nerves II-XII grossly intact. Coordination normal.  Psychiatric: Mood and affect normal. Behavior is normal. Judgment and thought content normal.     BMET    Component Value Date/Time   NA 141 10/04/2015 1505   K 3.9 10/04/2015 1505   CL 103 10/04/2015 1505   CO2 33 (H) 10/04/2015 1505   GLUCOSE 89 10/04/2015 1505   BUN 15 10/04/2015 1505   CREATININE 1.07 10/04/2015 1505   CALCIUM 9.8 10/04/2015 1505    Lipid Panel     Component Value Date/Time   CHOL 128 10/04/2015 1505   TRIG 169.0 (H) 10/04/2015 1505   HDL 24.80 (L) 10/04/2015 1505   CHOLHDL 5 10/04/2015 1505   VLDL 33.8 10/04/2015 1505   LDLCALC 69 10/04/2015 1505    CBC    Component Value Date/Time   WBC 6.4 10/04/2015 1505   RBC 5.17 10/04/2015 1505   HGB 15.3 10/04/2015 1505   HCT 45.0 10/04/2015 1505   PLT 245.0 10/04/2015 1505   MCV 87.0 10/04/2015 1505   MCHC 33.9 10/04/2015 1505   RDW 12.8 10/04/2015 1505    Hgb A1C No results found for: HGBA1C         Assessment & Plan:   Preventative Health Maintenance:  Encouraged him to get a flu shot in the fall Tetanus UTD Encouraged him to consume a balanced diet and exercise regimen Advised him to see an eye doctor and dentist annually Will check CBC, CMET, Lipid profile today  RTC in 1 year, sooner if  needed Webb Silversmith, NP

## 2016-11-09 ENCOUNTER — Other Ambulatory Visit: Payer: Self-pay | Admitting: Internal Medicine

## 2016-11-10 NOTE — Telephone Encounter (Signed)
Rx called in to pharmacy. 

## 2016-11-10 NOTE — Telephone Encounter (Signed)
Last filled 08/28/16... Please advise

## 2016-11-10 NOTE — Telephone Encounter (Signed)
Ok to phone in Ambien 

## 2016-11-25 ENCOUNTER — Encounter (INDEPENDENT_AMBULATORY_CARE_PROVIDER_SITE_OTHER): Payer: Self-pay

## 2016-11-25 ENCOUNTER — Encounter: Payer: Self-pay | Admitting: Internal Medicine

## 2016-11-25 ENCOUNTER — Ambulatory Visit (INDEPENDENT_AMBULATORY_CARE_PROVIDER_SITE_OTHER): Payer: BLUE CROSS/BLUE SHIELD | Admitting: Internal Medicine

## 2016-11-25 VITALS — BP 134/80 | HR 93 | Temp 98.4°F | Wt 184.0 lb

## 2016-11-25 DIAGNOSIS — R51 Headache: Secondary | ICD-10-CM | POA: Diagnosis not present

## 2016-11-25 DIAGNOSIS — R519 Headache, unspecified: Secondary | ICD-10-CM

## 2016-11-25 DIAGNOSIS — M791 Myalgia, unspecified site: Secondary | ICD-10-CM

## 2016-11-25 DIAGNOSIS — R5383 Other fatigue: Secondary | ICD-10-CM

## 2016-11-25 DIAGNOSIS — R509 Fever, unspecified: Secondary | ICD-10-CM | POA: Diagnosis not present

## 2016-11-25 LAB — COMPREHENSIVE METABOLIC PANEL
ALT: 150 U/L — ABNORMAL HIGH (ref 0–53)
AST: 91 U/L — ABNORMAL HIGH (ref 0–37)
Albumin: 3.9 g/dL (ref 3.5–5.2)
Alkaline Phosphatase: 139 U/L — ABNORMAL HIGH (ref 39–117)
BUN: 18 mg/dL (ref 6–23)
CO2: 33 meq/L — AB (ref 19–32)
Calcium: 9.2 mg/dL (ref 8.4–10.5)
Chloride: 100 mEq/L (ref 96–112)
Creatinine, Ser: 0.98 mg/dL (ref 0.40–1.50)
GFR: 89.01 mL/min (ref >60.00)
GLUCOSE: 135 mg/dL — AB (ref 70–99)
POTASSIUM: 3.5 meq/L (ref 3.5–5.1)
SODIUM: 137 meq/L (ref 135–145)
Total Bilirubin: 1.1 mg/dL (ref 0.2–1.2)
Total Protein: 6.7 g/dL (ref 6.0–8.3)

## 2016-11-25 LAB — MONONUCLEOSIS SCREEN: Mono Screen: NEGATIVE

## 2016-11-25 LAB — TSH: TSH: 1.87 u[IU]/mL (ref 0.35–4.50)

## 2016-11-25 NOTE — Progress Notes (Signed)
Subjective:    Patient ID: Patrick Robertson, male    DOB: 1975-11-20, 41 y.o.   MRN: [id number]  HPI  Pt presents to the clinic today with c/o headache, back pain, fatigue, fever and chills. He reports this started 3 weeks ago. The headache is located on the back of his head on the right side. He describes the pain as sharp and stabbing. He denies dizziness, visual changes, sensitivity to light or sound. His back pain is located mid back. He describes the pain as achy and sore. The pain does not radiate. He denies numbness or tingling in his legs. He denies issues with bowel or bladder. He denies nausea, vomiting, constipation or diarrhea. He denies urinary symptoms. He reports he has run fevers up to 100.5. He has chills and sweats. He has tried Ibuprofen and Vit C with minimal relief. He reports the only contributing factor is that he recently travelled to the [place] via airplane. He did sustain a mosquito bite while he was there. He denies any other changes in medications, diet, activity level. He denies known sick contacts.  Review of Systems      Past Medical History:  Diagnosis Date  . Basal cell carcinoma (BCC) of chin   . Hypertension     Current Outpatient Prescriptions  Medication Sig Dispense Refill  . lisinopril-hydrochlorothiazide (PRINZIDE,ZESTORETIC) 20-12.5 MG tablet Take 1 tablet by mouth daily. 90 tablet 3  . Multiple Vitamin (MULTIVITAMIN) tablet Take 1 tablet by mouth daily.    Marland Kitchen testosterone enanthate (DELATESTRYL) 200 MG/ML injection Inject 0.5 mLs (100 mg total) into the muscle once a week. 5 mL 5  . zolpidem (AMBIEN) 5 MG tablet TAKE 1 TABLET BY MOUTH AT BEDTIME AS NEEDED 30 tablet 0   No current facility-administered medications for this visit.     No Known Allergies  Family History  Problem Relation Age of Onset  . Hypertension Mother   . Hypertension Father   . Hypertension Maternal Grandmother   . Hypertension Maternal Grandfather   . Cancer Maternal  Grandfather   . Hypertension Paternal Grandmother   . Hypertension Paternal Grandfather   . Cancer Paternal Grandfather   . Hypertension Brother     Social History   Social History  . Marital status: Married    Spouse name: N/A  . Number of children: N/A  . Years of education: N/A   Occupational History  . Not on file.   Social History Main Topics  . Smoking status: Never Smoker  . Smokeless tobacco: Never Used  . Alcohol use No  . Drug use: No  . Sexual activity: Yes   Other Topics Concern  . Not on file   Social History Narrative  . No narrative on file     Constitutional: Pt reports fever, fatigue, headache. Denies abrupt weight changes.  HEENT: Denies eye pain, eye redness, ear pain, ringing in the ears, wax buildup, runny nose, nasal congestion, bloody nose, or sore throat. Respiratory: Denies difficulty breathing, shortness of breath, cough or sputum production.   Cardiovascular: Denies chest pain, chest tightness, palpitations or swelling in the hands or feet.  Gastrointestinal: Denies abdominal pain, bloating, constipation, diarrhea or blood in the stool.  GU: Denies urgency, frequency, pain with urination, burning sensation, blood in urine, odor or discharge. Musculoskeletal: Pt reports  Muscle aches and back pain. Denies decrease in range of motion, difficulty with gait, or joint pain and swelling.  Skin: Denies redness, rashes, lesions or ulcercations.  Neurological: Denies  dizziness, difficulty with memory, difficulty with speech or problems with balance and coordination.    No other specific complaints in a complete review of systems (except as listed in HPI above).  Objective:   Physical Exam   BP 134/80   Pulse 93   Temp 98.4 F (36.9 C) (Oral)   Wt 184 lb (83.5 kg)   SpO2 97%   BMI 28.60 kg/m  Wt Readings from Last 3 Encounters:  11/25/16 184 lb (83.5 kg)  10/07/16 181 lb 4 oz (82.2 kg)  12/11/15 182 lb (82.6 kg)    General: Appears his  stated age, in NAD. Skin: Warm and clammy. HEENT:  Throat/Mouth: Teeth present, mucosa pink and moist, no exudate, lesions or ulcerations noted.  Neck:  No adenopathy noted.  Cardiovascular: Normal rate and rhythm. S1,S2 noted.  No murmur, rubs or gallops noted.  Pulmonary/Chest: Normal effort and positive vesicular breath sounds. No respiratory distress. No wheezes, rales or ronchi noted.  Abdomen: Soft and nontender. Normal bowel sounds. No distention or masses noted.  Musculoskeletal: No pain with palpation of the spine. Negative nuchal rigidity. No difficulty with gait.  Neurological: Alert and oriented.     BMET    Component Value Date/Time   NA 139 10/07/2016 1509   K 4.0 10/07/2016 1509   CL 99 10/07/2016 1509   CO2 35 (H) 10/07/2016 1509   GLUCOSE 91 10/07/2016 1509   BUN 14 10/07/2016 1509   CREATININE 1.10 10/07/2016 1509   CALCIUM 10.0 10/07/2016 1509    Lipid Panel     Component Value Date/Time   CHOL 131 10/07/2016 1509   TRIG 251.0 (H) 10/07/2016 1509   HDL 20.80 (L) 10/07/2016 1509   CHOLHDL 6 10/07/2016 1509   VLDL 50.2 (H) 10/07/2016 1509   LDLCALC 69 10/04/2015 1505    CBC    Component Value Date/Time   WBC 7.2 10/07/2016 1509   RBC 5.91 (H) 10/07/2016 1509   HGB 17.9 (H) 10/07/2016 1509   HCT 50.1 10/07/2016 1509   PLT 274.0 10/07/2016 1509   MCV 84.9 10/07/2016 1509   MCHC 35.7 10/07/2016 1509   RDW 12.9 10/07/2016 1509    Hgb A1C No results found for: HGBA1C         Assessment & Plan:   Fever and Chills, Fatigue, Headache, Muscle Aches:  Exam benign Will check CBC, CMET, TSH and Mono spot today Offered chest xray but he declines at this time Continue Ibuprofen for now  Will follow up after labs, return/ER precautions discussed Webb Silversmith, NP

## 2016-11-25 NOTE — Patient Instructions (Signed)
Fever, Adult A fever is an increase in the body's temperature. It is often defined as a temperature of 100 F (38C) or higher. Short mild or moderate fevers often have no long-term effects. They also often do not need treatment. Moderate or high fevers may make you feel uncomfortable. Sometimes, they can also be a sign of a serious illness or disease. The sweating that may happen with repeated fevers or fevers that last a while may also cause you to not have enough fluid in your body (dehydration). You can take your temperature with a thermometer to see if you have a fever. A measured temperature can change with:  Age.  Time of day.  Where the thermometer is placed: ? Mouth (oral). ? Rectum (rectal). ? Ear (tympanic). ? Underarm (axillary). ? Forehead (temporal).  Follow these instructions at home: Pay attention to any changes in your symptoms. Take these actions to help with your condition:  Take over-the-counter and prescription medicines only as told by your doctor. Follow the dosing instructions carefully.  If you were prescribed an antibiotic medicine, take it as told by your doctor. Do not stop taking the antibiotic even if you start to feel better.  Rest as needed.  Drink enough fluid to keep your pee (urine) clear or pale yellow.  Sponge yourself or bathe with room-temperature water as needed. This helps to lower your body temperature . Do not use ice water.  Do not wear too many blankets or heavy clothes.  Contact a doctor if:  You throw up (vomit).  You cannot eat or drink without throwing up.  You have watery poop (diarrhea).  It hurts when you pee.  Your symptoms do not get better with treatment.  You have new symptoms.  You feel very weak. Get help right away if:  You are short of breath or have trouble breathing.  You are dizzy or you pass out (faint).  You feel confused.  You have signs of not having enough fluid in your body, such as: ? A dry  mouth. ? Peeing less. ? Looking pale.  You have very bad pain in your belly (abdomen).  You keep throwing up or having water poop.  You have a skin rash.  Your symptoms suddenly get worse. This information is not intended to replace advice given to you by your health care provider. Make sure you discuss any questions you have with your health care provider. Document Released: 01/07/2008 Document Revised: 09/05/2015 Document Reviewed: 05/24/2014 Elsevier Interactive Patient Education  2018 Elsevier Inc.  

## 2016-11-26 ENCOUNTER — Ambulatory Visit
Admission: RE | Admit: 2016-11-26 | Discharge: 2016-11-26 | Disposition: A | Discharge status: Home / Self Care | Payer: BLUE CROSS/BLUE SHIELD | Source: Ambulatory Visit | Attending: Internal Medicine | Admitting: Internal Medicine

## 2016-11-26 ENCOUNTER — Other Ambulatory Visit: Payer: Self-pay | Admitting: Internal Medicine

## 2016-11-26 ENCOUNTER — Telehealth: Payer: Self-pay

## 2016-11-26 ENCOUNTER — Encounter: Payer: Self-pay | Admitting: Internal Medicine

## 2016-11-26 ENCOUNTER — Other Ambulatory Visit (INDEPENDENT_AMBULATORY_CARE_PROVIDER_SITE_OTHER): Payer: BLUE CROSS/BLUE SHIELD

## 2016-11-26 ENCOUNTER — Other Ambulatory Visit: Payer: BLUE CROSS/BLUE SHIELD

## 2016-11-26 DIAGNOSIS — R509 Fever, unspecified: Secondary | ICD-10-CM

## 2016-11-26 DIAGNOSIS — K824 Cholesterolosis of gallbladder: Secondary | ICD-10-CM | POA: Diagnosis not present

## 2016-11-26 DIAGNOSIS — R748 Abnormal levels of other serum enzymes: Secondary | ICD-10-CM

## 2016-11-26 DIAGNOSIS — R5383 Other fatigue: Secondary | ICD-10-CM

## 2016-11-26 DIAGNOSIS — D729 Disorder of white blood cells, unspecified: Secondary | ICD-10-CM

## 2016-11-26 DIAGNOSIS — R945 Abnormal results of liver function studies: Secondary | ICD-10-CM | POA: Diagnosis not present

## 2016-11-26 LAB — CBC WITH DIFFERENTIAL/PLATELET
BASOS ABS: 0.1 10*3/uL (ref 0.0–0.1)
Basophils Relative: 0.9 % (ref 0.0–3.0)
EOS PCT: 1.6 % (ref 0.0–5.0)
Eosinophils Absolute: 0.1 10*3/uL (ref 0.0–0.7)
HCT: 44.2 % (ref 39.0–52.0)
Hemoglobin: 15.1 g/dL (ref 13.0–17.0)
LYMPHS ABS: 3.8 10*3/uL (ref 0.7–4.0)
Lymphocytes Relative: 55.1 % — ABNORMAL HIGH (ref 12.0–46.0)
MCHC: 34.2 g/dL (ref 30.0–36.0)
MCV: 86.9 fl (ref 78.0–100.0)
MONO ABS: 0.4 10*3/uL (ref 0.1–1.0)
MONOS PCT: 5.9 % (ref 3.0–12.0)
NEUTROS ABS: 2.5 10*3/uL (ref 1.4–7.7)
NEUTROS PCT: 36.5 % — AB (ref 43.0–77.0)
Platelets: 161 10*3/uL (ref 150.0–400.0)
RBC: 5.09 Mil/uL (ref 4.22–5.81)
RDW: 13 % (ref 11.5–15.5)
WBC: 6.8 10*3/uL (ref 4.0–10.5)

## 2016-11-26 NOTE — Telephone Encounter (Signed)
Viewed by Rolla Plate on 11/26/2016 4:25 PM  Written by Jearld Fenton, NP on 11/26/2016 3:57 PM  Ultrasound of the abdomen is normal. We will await labs that you had drawn today.

## 2016-11-26 NOTE — Telephone Encounter (Signed)
Call pt:  Ultrasound was normal. Will await additional labs.

## 2016-11-26 NOTE — Telephone Encounter (Addendum)
Kim with [place] at [facility] called report and pt is waiting; report is in Epic and copy taken to [employer] NP. Avie Echevaria NP advised pt could go home and Maudie Mercury will let pt know.

## 2016-11-26 NOTE — Addendum Note (Signed)
Addended by: Ellamae Sia on: 11/26/2016 02:54 PM   Modules accepted: Orders

## 2016-11-27 ENCOUNTER — Telehealth: Payer: Self-pay | Admitting: Internal Medicine

## 2016-11-27 ENCOUNTER — Encounter: Payer: Self-pay | Admitting: Internal Medicine

## 2016-11-27 LAB — PATHOLOGIST SMEAR REVIEW

## 2016-11-27 LAB — HEPATITIS PANEL, ACUTE
HCV Ab: NONREACTIVE
HEP A IGM: NONREACTIVE
Hep B C IgM: NONREACTIVE
Hepatitis B Surface Ag: NONREACTIVE

## 2016-11-27 LAB — HEPATITIS B SURFACE ANTIBODY, QUANTITATIVE: HEPATITIS B-POST: 440 m[IU]/mL (ref >=10)

## 2016-11-27 NOTE — Telephone Encounter (Signed)
Test has been added by phone at [facility],

## 2016-11-27 NOTE — Telephone Encounter (Signed)
Patient's wife,[name],called.  Rollene Fare told her that the [place] virus test would be added on to patient's lab work.  [name] wants to know if patient needs to come back in for another lab draw, or if it will be added on to the labs he had drawn.

## 2016-11-28 LAB — CMV (CYTOMEGALOVIRUS) DNA ULTRAQUANT, PCR
CMV DNA, QN PCR: 3.14 {Log_IU}/mL — AB (ref <2.30)
CMV DNA, QN Real Time PCR: 1374 IU/mL — ABNORMAL HIGH (ref <200)

## 2016-11-30 LAB — WEST NILE ANTIBODIES, IGG AND IGM

## 2016-12-01 ENCOUNTER — Encounter: Payer: Self-pay | Admitting: Internal Medicine

## 2016-12-01 DIAGNOSIS — B259 Cytomegaloviral disease, unspecified: Secondary | ICD-10-CM

## 2016-12-04 ENCOUNTER — Other Ambulatory Visit: Payer: Self-pay | Admitting: Internal Medicine

## 2016-12-04 ENCOUNTER — Other Ambulatory Visit (INDEPENDENT_AMBULATORY_CARE_PROVIDER_SITE_OTHER): Payer: BLUE CROSS/BLUE SHIELD

## 2016-12-04 DIAGNOSIS — R7989 Other specified abnormal findings of blood chemistry: Secondary | ICD-10-CM

## 2016-12-04 DIAGNOSIS — B251 Cytomegaloviral hepatitis: Secondary | ICD-10-CM

## 2016-12-04 DIAGNOSIS — R799 Abnormal finding of blood chemistry, unspecified: Secondary | ICD-10-CM | POA: Diagnosis not present

## 2016-12-04 LAB — COMPREHENSIVE METABOLIC PANEL
ALK PHOS: 154 U/L — AB (ref 39–117)
ALT: 177 U/L — AB (ref 0–53)
AST: 98 U/L — ABNORMAL HIGH (ref 0–37)
Albumin: 3.6 g/dL (ref 3.5–5.2)
BILIRUBIN TOTAL: 0.9 mg/dL (ref 0.2–1.2)
BUN: 14 mg/dL (ref 6–23)
CALCIUM: 9.2 mg/dL (ref 8.4–10.5)
CO2: 32 mEq/L (ref 19–32)
Chloride: 100 mEq/L (ref 96–112)
Creatinine, Ser: 1.01 mg/dL (ref 0.40–1.50)
GFR: 85.95 mL/min (ref >60.00)
Glucose, Bld: 91 mg/dL (ref 70–99)
Potassium: 4 mEq/L (ref 3.5–5.1)
Sodium: 138 mEq/L (ref 135–145)
TOTAL PROTEIN: 6.9 g/dL (ref 6.0–8.3)

## 2016-12-04 LAB — CBC WITH DIFFERENTIAL/PLATELET
BASOS ABS: 0 10*3/uL (ref 0.0–0.1)
Basophils Relative: 0.6 % (ref 0.0–3.0)
EOS PCT: 1 % (ref 0.0–5.0)
Eosinophils Absolute: 0.1 10*3/uL (ref 0.0–0.7)
HEMATOCRIT: 41.3 % (ref 39.0–52.0)
Hemoglobin: 14.1 g/dL (ref 13.0–17.0)
LYMPHS PCT: 73.3 % — AB (ref 12.0–46.0)
Lymphs Abs: 6.4 10*3/uL — ABNORMAL HIGH (ref 0.7–4.0)
MCHC: 34.1 g/dL (ref 30.0–36.0)
MCV: 86.9 fl (ref 78.0–100.0)
MONOS PCT: 6.5 % (ref 3.0–12.0)
Monocytes Absolute: 0.6 10*3/uL (ref 0.1–1.0)
NEUTROS ABS: 1.6 10*3/uL (ref 1.4–7.7)
Neutrophils Relative %: 18.6 % — ABNORMAL LOW (ref 43.0–77.0)
Platelets: 181 10*3/uL (ref 150.0–400.0)
RBC: 4.76 Mil/uL (ref 4.22–5.81)
RDW: 13.4 % (ref 11.5–15.5)
WBC: 8.7 10*3/uL (ref 4.0–10.5)

## 2016-12-10 ENCOUNTER — Other Ambulatory Visit (INDEPENDENT_AMBULATORY_CARE_PROVIDER_SITE_OTHER): Payer: BLUE CROSS/BLUE SHIELD

## 2016-12-10 DIAGNOSIS — B259 Cytomegaloviral disease, unspecified: Secondary | ICD-10-CM | POA: Diagnosis not present

## 2016-12-10 LAB — CBC WITH DIFFERENTIAL/PLATELET
BASOS ABS: 0.1 10*3/uL (ref 0.0–0.1)
BASOS PCT: 0.9 % (ref 0.0–3.0)
EOS ABS: 0.1 10*3/uL (ref 0.0–0.7)
Eosinophils Relative: 1.1 % (ref 0.0–5.0)
HEMATOCRIT: 42.7 % (ref 39.0–52.0)
HEMOGLOBIN: 14.4 g/dL (ref 13.0–17.0)
LYMPHS PCT: 47.6 % — AB (ref 12.0–46.0)
Lymphs Abs: 3 10*3/uL (ref 0.7–4.0)
MCHC: 33.8 g/dL (ref 30.0–36.0)
MCV: 88.7 fl (ref 78.0–100.0)
Monocytes Absolute: 0.5 10*3/uL (ref 0.1–1.0)
Monocytes Relative: 7.9 % (ref 3.0–12.0)
Neutro Abs: 2.6 10*3/uL (ref 1.4–7.7)
Neutrophils Relative %: 42.5 % — ABNORMAL LOW (ref 43.0–77.0)
Platelets: 210 10*3/uL (ref 150.0–400.0)
RBC: 4.81 Mil/uL (ref 4.22–5.81)
RDW: 13.8 % (ref 11.5–15.5)
WBC: 6.2 10*3/uL (ref 4.0–10.5)

## 2016-12-10 LAB — COMPREHENSIVE METABOLIC PANEL
ALBUMIN: 3.9 g/dL (ref 3.5–5.2)
ALT: 138 U/L — ABNORMAL HIGH (ref 0–53)
AST: 70 U/L — AB (ref 0–37)
Alkaline Phosphatase: 129 U/L — ABNORMAL HIGH (ref 39–117)
BUN: 14 mg/dL (ref 6–23)
CALCIUM: 9.3 mg/dL (ref 8.4–10.5)
CHLORIDE: 104 meq/L (ref 96–112)
CO2: 33 mEq/L — ABNORMAL HIGH (ref 19–32)
CREATININE: 1.16 mg/dL (ref 0.40–1.50)
GFR: 73.25 mL/min (ref >60.00)
Glucose, Bld: 92 mg/dL (ref 70–99)
Potassium: 4 mEq/L (ref 3.5–5.1)
Sodium: 142 mEq/L (ref 135–145)
TOTAL PROTEIN: 7.1 g/dL (ref 6.0–8.3)
Total Bilirubin: 0.9 mg/dL (ref 0.2–1.2)

## 2016-12-11 LAB — CMV ABS, IGG+IGM (CYTOMEGALOVIRUS)
CMV IGM: 158 [AU]/ml — AB (ref <30.00)
Cytomegalovirus Ab-IgG: 6.1 U/mL — ABNORMAL HIGH (ref <0.60)

## 2016-12-13 LAB — CMV (CYTOMEGALOVIRUS) DNA ULTRAQUANT, PCR

## 2016-12-15 DIAGNOSIS — B2710 Cytomegaloviral mononucleosis without complications: Secondary | ICD-10-CM | POA: Diagnosis not present

## 2016-12-15 DIAGNOSIS — R945 Abnormal results of liver function studies: Secondary | ICD-10-CM | POA: Diagnosis not present

## 2017-01-05 DIAGNOSIS — L821 Other seborrheic keratosis: Secondary | ICD-10-CM | POA: Diagnosis not present

## 2017-01-05 DIAGNOSIS — Z85828 Personal history of other malignant neoplasm of skin: Secondary | ICD-10-CM | POA: Diagnosis not present

## 2017-01-05 DIAGNOSIS — D225 Melanocytic nevi of trunk: Secondary | ICD-10-CM | POA: Diagnosis not present

## 2017-01-05 DIAGNOSIS — D1801 Hemangioma of skin and subcutaneous tissue: Secondary | ICD-10-CM | POA: Diagnosis not present

## 2017-01-09 ENCOUNTER — Other Ambulatory Visit: Payer: Self-pay | Admitting: Internal Medicine

## 2017-01-12 NOTE — Telephone Encounter (Signed)
Rx called in to pharmacy. 

## 2017-01-12 NOTE — Telephone Encounter (Signed)
Last filled 11/10/16... Please advise

## 2017-01-12 NOTE — Telephone Encounter (Signed)
Ok to phone in Ambien 

## 2017-02-05 ENCOUNTER — Telehealth: Payer: Self-pay

## 2017-02-05 NOTE — Telephone Encounter (Signed)
Baity out of office.Marland KitchenMarland KitchenReceived fax requesting Rx for Testosterone cypionate from Testosterone enanthate.... Please advise if appropriate to change

## 2017-02-07 NOTE — Telephone Encounter (Signed)
Okay to change, same dose and same instructions.

## 2017-02-08 ENCOUNTER — Telehealth: Payer: Self-pay | Admitting: Internal Medicine

## 2017-02-08 ENCOUNTER — Encounter: Payer: Self-pay | Admitting: Internal Medicine

## 2017-02-08 NOTE — Telephone Encounter (Signed)
Pt prefers the enanthate, so does not want to change medication

## 2017-02-08 NOTE — Telephone Encounter (Signed)
Copied from [place] #[id number]. Topic: Inquiry >> Feb 08, 2017 10:52 AM Oliver Pila B wrote: Reason for CRM: PT called back to let Dr. Tomasita Morrow know the he doesn't need any changes to his Rx, was able to fill meds at [facility] and will continue to take as normal, and contact him if any questions

## 2017-02-09 NOTE — Telephone Encounter (Signed)
I already spoke to and his wife

## 2017-03-16 ENCOUNTER — Other Ambulatory Visit: Payer: Self-pay | Admitting: Internal Medicine

## 2017-03-17 NOTE — Telephone Encounter (Signed)
Rx called in to pharmacy. 

## 2017-03-17 NOTE — Telephone Encounter (Signed)
Last filled 01/12/17... Please advise

## 2017-03-17 NOTE — Telephone Encounter (Signed)
Ok to phone in Ambien 

## 2017-05-14 DIAGNOSIS — T1512XA Foreign body in conjunctival sac, left eye, initial encounter: Secondary | ICD-10-CM | POA: Diagnosis not present

## 2017-05-28 ENCOUNTER — Other Ambulatory Visit: Payer: Self-pay | Admitting: Internal Medicine

## 2017-05-28 NOTE — Telephone Encounter (Signed)
Last filled 03/17/2017.Marland Kitchen Please advise

## 2017-06-07 ENCOUNTER — Other Ambulatory Visit: Payer: Self-pay | Admitting: Internal Medicine

## 2017-06-07 NOTE — Telephone Encounter (Signed)
Copied from [place] [id number]. Topic: Quick Communication - Rx Refill/Question >> Jun 07, 2017 10:14 AM Cleaster Corin, NT wrote: Medication: testosterone enanthate (DELATESTRYL) 200 MG/ML injection [[id number]]   Has the patient contacted their pharmacy? yes   (Agent: If no, request that the patient contact the pharmacy for the refill.)   Preferred Pharmacy (with phone number or street name):[facility] [address], [address] [place] [address] Phone: [phone] Fax: [phone]     Agent: Please be advised that RX refills may take up to 3 business days. We ask that you follow-up with your pharmacy.

## 2017-06-07 NOTE — Telephone Encounter (Signed)
LOV: 10/07/16  ZNB:VAPOLI Baity,NP  [facility]

## 2017-06-08 MED ORDER — TESTOSTERONE ENANTHATE 200 MG/ML IM SOLN: 100.0000 mg | INTRAMUSCULAR | 5 refills | Status: DC

## 2017-06-08 NOTE — Telephone Encounter (Signed)
Last filled 10/07/16 with 5 refills at [facility]... Please advise

## 2017-06-08 NOTE — Addendum Note (Signed)
Addended by: Jearld Fenton on: 06/08/2017 06:10 PM   Modules accepted: Orders

## 2017-06-08 NOTE — Telephone Encounter (Signed)
Testosterone sent to pharmacy.

## 2017-06-09 ENCOUNTER — Telehealth: Payer: Self-pay | Admitting: Internal Medicine

## 2017-06-09 MED ORDER — TESTOSTERONE CYPIONATE 200 MG/ML IM SOLN: 100.0000 mg | Freq: Once | INTRAMUSCULAR | 0 refills | Status: DC

## 2017-06-09 NOTE — Telephone Encounter (Signed)
Copied from [place]. Topic: Quick Communication - See Telephone Encounter >> Jun 09, 2017  9:59 AM Bea Graff, NT wrote: CRM for notification. See Telephone encounter for: [facility] calling to see if the doctor wanted to do the rx for  testosterone enanthate (DELATESTRYL) 200 MG/ML injection in the compound medication made with grape seed oil? CB#: [phone]  06/09/17.

## 2017-06-09 NOTE — Telephone Encounter (Signed)
I want just plain testosterone

## 2017-06-09 NOTE — Addendum Note (Signed)
Addended by: Jearld Fenton on: 06/09/2017 01:45 PM   Modules accepted: Orders

## 2017-06-09 NOTE — Addendum Note (Signed)
Addended by: Lurlean Nanny on: 06/09/2017 01:12 PM   Modules accepted: Orders

## 2017-06-10 ENCOUNTER — Telehealth: Payer: Self-pay | Admitting: Internal Medicine

## 2017-06-10 MED ORDER — TESTOSTERONE CYPIONATE 200 MG/ML IM SOLN: 100.0000 mg | INTRAMUSCULAR | 0 refills | Status: DC

## 2017-06-10 NOTE — Addendum Note (Signed)
Addended by: Lurlean Nanny on: 06/10/2017 09:51 AM   Modules accepted: Orders

## 2017-06-10 NOTE — Telephone Encounter (Signed)
Patrick Robertson with [facility] calls back to clarify instructions for testosterone; Patrick Robertson will have new rx sent. Patrick Robertson voiced understanding and will d/c rx presently has.

## 2017-06-10 NOTE — Telephone Encounter (Signed)
Copied from [place] [phone]. Topic: Quick Communication - See Telephone Encounter >> Jun 10, 2017  8:53 AM Bea Graff, NT wrote: CRM for notification. See Telephone encounter for: Patrick Robertson with [facility] is wanting to know if its ok to prescribe the Testerone injection with the compound that has grape seed oil in the medication as well. CB#: [phone]. She will also be faxing over information.   06/10/17.

## 2017-06-10 NOTE — Telephone Encounter (Signed)
I called [facility] spoke with Langley Gauss and notified as instructed from the phone note on 06/09/17. Langley Gauss will D/C the 06/08/17 rx and fill the testosterone cypionate sent on 06/09/17.

## 2017-06-11 MED ORDER — TESTOSTERONE CYPIONATE 200 MG/ML IM SOLN: 100.0000 mg | INTRAMUSCULAR | 0 refills | Status: DC

## 2017-06-11 NOTE — Addendum Note (Signed)
Addended by: Jearld Fenton on: 06/11/2017 04:39 PM   Modules accepted: Orders

## 2017-06-11 NOTE — Addendum Note (Signed)
Addended by: Lurlean Nanny on: 06/11/2017 04:36 PM   Modules accepted: Orders

## 2017-06-11 NOTE — Telephone Encounter (Signed)
Patient called and said it was a miss up with his prescription for testosterone cypionate (DEPOTESTOSTERONE CYPIONATE) 200 MG/ML injection at the pharmacy. Pt would like this medication to go to [facility].

## 2017-06-14 ENCOUNTER — Other Ambulatory Visit: Payer: Self-pay | Admitting: Internal Medicine

## 2017-06-20 ENCOUNTER — Encounter: Payer: Self-pay | Admitting: Internal Medicine

## 2017-06-22 ENCOUNTER — Other Ambulatory Visit: Payer: Self-pay | Admitting: Internal Medicine

## 2017-06-28 NOTE — Telephone Encounter (Signed)
Baity out of the office.Marland KitchenMarland KitchenLast filled 05/31/2017... Please advise

## 2017-07-27 ENCOUNTER — Other Ambulatory Visit: Payer: Self-pay

## 2017-07-27 NOTE — Telephone Encounter (Signed)
Baity pt.Marland KitchenMarland KitchenLast filled 06/11/2017... Please advise

## 2017-07-28 MED ORDER — TESTOSTERONE ENANTHATE 200 MG/ML IM SOLN: 100.0000 mg | INTRAMUSCULAR | 0 refills | Status: DC

## 2017-07-28 NOTE — Telephone Encounter (Signed)
Sent. Thanks.   

## 2017-09-20 ENCOUNTER — Other Ambulatory Visit: Payer: Self-pay | Admitting: Family Medicine

## 2017-09-20 NOTE — Telephone Encounter (Signed)
Name of Medication: Testosterone Name of Pharmacy: [facility] or Written Date and Quantity:  5 mL 0 07/28/2017  Last Office Visit and Type: 11/25/16 Acute, CPE earlier in August 2018 Next Office Visit and Type: None Last Controlled Substance Agreement Date:  Last UDS:

## 2017-09-21 DIAGNOSIS — S46292A Other injury of muscle, fascia and tendon of other parts of biceps, left arm, initial encounter: Secondary | ICD-10-CM | POA: Diagnosis not present

## 2017-09-22 ENCOUNTER — Other Ambulatory Visit: Payer: Self-pay | Admitting: Specialist

## 2017-09-24 ENCOUNTER — Encounter
Admission: RE | Admit: 2017-09-24 | Discharge: 2017-09-24 | Disposition: A | Discharge status: Home / Self Care | Payer: BLUE CROSS/BLUE SHIELD | Source: Ambulatory Visit | Attending: Specialist | Admitting: Specialist

## 2017-09-24 ENCOUNTER — Other Ambulatory Visit: Payer: Self-pay

## 2017-09-24 DIAGNOSIS — Z01818 Encounter for other preprocedural examination: Secondary | ICD-10-CM | POA: Insufficient documentation

## 2017-09-24 NOTE — Patient Instructions (Signed)
Your procedure is scheduled on: Monday, September 27, 2017 Report to Day Surgery on the 2nd floor of the [facility] at 12:00 pm Please call [phone] if you have any questions.  REMEMBER: Instructions that are not followed completely may result in serious medical risk, up to and including death; or upon the discretion of your surgeon and anesthesiologist your surgery may need to be rescheduled.  Do not eat food after midnight the night before your procedure.  No gum chewing, lozengers or hard candies.  You may however, drink CLEAR liquids up to 2 hours before you are scheduled to arrive for your surgery. Do not drink anything within 2 hours of the start of your surgery.  Clear liquids include: - water  - apple juice without pulp - clear gatorade - black coffee or tea (Do NOT add anything to the coffee or tea) Do NOT drink anything that is not on this list.  No Alcohol for 24 hours before or after surgery.  No Smoking including e-cigarettes for 24 hours prior to surgery.  No chewable tobacco products for at least 6 hours prior to surgery.  No nicotine patches on the day of surgery.  On the morning of surgery brush your teeth with toothpaste and water, you may rinse your mouth with mouthwash if you wish. Do not swallow any toothpaste or mouthwash.  Notify your doctor if there is any change in your medical condition (cold, fever, infection).  Do not wear jewelry, make-up, hairpins, clips or nail polish.  Do not wear lotions, powders, or perfumes. You may wear deodorant.  Do not shave 48 hours prior to surgery. Men may shave face and neck.  Contacts and dentures may not be worn into surgery.  Do not bring valuables to the hospital, including drivers license, insurance or credit cards.  [facility] is not responsible for any belongings or valuables.   TAKE THESE MEDICATIONS THE MORNING OF SURGERY: none  Use CHG Soap as directed on instruction sheet.  NOW!  Stop  Anti-inflammatories (NSAIDS) such as Advil, Aleve, Ibuprofen, Motrin, Naproxen, Naprosyn and Aspirin based products such as Excedrin, Goodys Powder, BC Powder. (May take Tylenol or Acetaminophen if needed.)  NOW!  Stop ANY OVER THE COUNTER supplements until after surgery. (May continue multivitamin.)  Wear comfortable clothing (specific to your surgery type) to the hospital.  Plan for stool softeners for home use.  If you are being discharged the day of surgery, you will not be allowed to drive home. You will need a responsible adult to drive you home and stay with you that night.   If you are taking public transportation, you will need to have a responsible adult with you. Please confirm with your physician that it is acceptable to use public transportation.   Please call [phone] if you have any questions about these instructions.

## 2017-09-24 NOTE — Pre-Procedure Instructions (Signed)
Per patient request, Dr. Rosey Bath (anesthesia) was notified of patient's only history of HTN and patient not wanting an EKG ordered. Dr. Rosey Bath in agreement that an EKG was not necessary prior to this surgery.

## 2017-09-26 MED ORDER — CEFAZOLIN SODIUM-DEXTROSE 2-4 GM/100ML-% IV SOLN
2.0000 g | INTRAVENOUS | Status: AC
  Administered 2017-09-27: 2 g via INTRAVENOUS

## 2017-09-26 MED ORDER — CLINDAMYCIN PHOSPHATE 600 MG/50ML IV SOLN
600.0000 mg | INTRAVENOUS | Status: AC
  Administered 2017-09-27: 600 mg via INTRAVENOUS

## 2017-09-27 ENCOUNTER — Ambulatory Visit: Payer: BLUE CROSS/BLUE SHIELD | Admitting: Anesthesiology

## 2017-09-27 ENCOUNTER — Ambulatory Visit
Admission: RE | Admit: 2017-09-27 | Discharge: 2017-09-27 | Disposition: A | Discharge status: Home / Self Care | Payer: BLUE CROSS/BLUE SHIELD | Source: Ambulatory Visit | Attending: Specialist | Admitting: Specialist

## 2017-09-27 ENCOUNTER — Encounter
Admission: RE | Disposition: A | Discharge status: Home / Self Care | Payer: Self-pay | Source: Ambulatory Visit | Attending: Specialist

## 2017-09-27 DIAGNOSIS — S46212A Strain of muscle, fascia and tendon of other parts of biceps, left arm, initial encounter: Secondary | ICD-10-CM | POA: Insufficient documentation

## 2017-09-27 DIAGNOSIS — M79622 Pain in left upper arm: Secondary | ICD-10-CM | POA: Diagnosis not present

## 2017-09-27 DIAGNOSIS — I10 Essential (primary) hypertension: Secondary | ICD-10-CM | POA: Diagnosis not present

## 2017-09-27 DIAGNOSIS — X58XXXA Exposure to other specified factors, initial encounter: Secondary | ICD-10-CM | POA: Insufficient documentation

## 2017-09-27 DIAGNOSIS — Z85828 Personal history of other malignant neoplasm of skin: Secondary | ICD-10-CM | POA: Insufficient documentation

## 2017-09-27 DIAGNOSIS — G8918 Other acute postprocedural pain: Secondary | ICD-10-CM | POA: Diagnosis not present

## 2017-09-27 DIAGNOSIS — Y939 Activity, unspecified: Secondary | ICD-10-CM | POA: Diagnosis not present

## 2017-09-27 DIAGNOSIS — S46292A Other injury of muscle, fascia and tendon of other parts of biceps, left arm, initial encounter: Secondary | ICD-10-CM | POA: Diagnosis not present

## 2017-09-27 HISTORY — PX: DISTAL BICEPS TENDON REPAIR: SHX1461

## 2017-09-27 SURGERY — REPAIR, TENDON, BICEPS, DISTAL
Anesthesia: General | Site: Arm Upper | Laterality: Left | Wound class: "Clean "

## 2017-09-27 MED ORDER — LIDOCAINE HCL (PF) 2 % IJ SOLN
INTRAMUSCULAR | Status: AC
  Filled 2017-09-27: qty 10

## 2017-09-27 MED ORDER — BUPIVACAINE HCL (PF) 0.5 % IJ SOLN
INTRAMUSCULAR | Status: AC
  Filled 2017-09-27: qty 10

## 2017-09-27 MED ORDER — LIDOCAINE HCL (CARDIAC) PF 100 MG/5ML IV SOSY
PREFILLED_SYRINGE | INTRAVENOUS | Status: DC | PRN
  Administered 2017-09-27: 100 mg via INTRATRACHEAL

## 2017-09-27 MED ORDER — OXYCODONE HCL 5 MG PO TABS
ORAL_TABLET | ORAL | Status: AC
  Filled 2017-09-27: qty 1

## 2017-09-27 MED ORDER — HYDROCODONE-ACETAMINOPHEN 5-325 MG PO TABS: 1.0000 | ORAL_TABLET | Freq: Four times a day (QID) | ORAL | 0 refills | Status: DC | PRN

## 2017-09-27 MED ORDER — FAMOTIDINE 20 MG PO TABS
20.0000 mg | ORAL_TABLET | Freq: Once | ORAL | Status: AC
  Administered 2017-09-27: 20 mg via ORAL

## 2017-09-27 MED ORDER — DEXAMETHASONE SODIUM PHOSPHATE 10 MG/ML IJ SOLN
INTRAMUSCULAR | Status: AC
  Filled 2017-09-27: qty 1

## 2017-09-27 MED ORDER — FENTANYL CITRATE (PF) 100 MCG/2ML IJ SOLN
INTRAMUSCULAR | Status: DC | PRN
  Administered 2017-09-27: 25 ug via INTRAVENOUS

## 2017-09-27 MED ORDER — MIDAZOLAM HCL 2 MG/2ML IJ SOLN
1.0000 mg | Freq: Once | INTRAMUSCULAR | Status: AC
  Administered 2017-09-27: 1 mg via INTRAVENOUS

## 2017-09-27 MED ORDER — BUPIVACAINE HCL (PF) 0.5 % IJ SOLN
INTRAMUSCULAR | Status: DC | PRN
  Administered 2017-09-27: 30 mL

## 2017-09-27 MED ORDER — MELOXICAM 7.5 MG PO TABS
ORAL_TABLET | ORAL | Status: AC
  Administered 2017-09-27: 15 mg via ORAL
  Filled 2017-09-27: qty 2

## 2017-09-27 MED ORDER — ONDANSETRON HCL 4 MG/2ML IJ SOLN
INTRAMUSCULAR | Status: DC | PRN
  Administered 2017-09-27: 4 mg via INTRAVENOUS

## 2017-09-27 MED ORDER — MELOXICAM 15 MG PO TABS: 15.0000 mg | ORAL_TABLET | Freq: Every day | ORAL | 3 refills | Status: DC

## 2017-09-27 MED ORDER — LACTATED RINGERS IV SOLN
INTRAVENOUS | Status: DC
  Administered 2017-09-27 (×2): via INTRAVENOUS

## 2017-09-27 MED ORDER — PROMETHAZINE HCL 25 MG/ML IJ SOLN: 6.2500 mg | INTRAMUSCULAR | Status: DC | PRN

## 2017-09-27 MED ORDER — PROPOFOL 10 MG/ML IV BOLUS
INTRAVENOUS | Status: DC | PRN
  Administered 2017-09-27: 30 mg via INTRAVENOUS
  Administered 2017-09-27: 50 mg via INTRAVENOUS

## 2017-09-27 MED ORDER — GABAPENTIN 400 MG PO CAPS
ORAL_CAPSULE | ORAL | Status: AC
  Filled 2017-09-27: qty 1

## 2017-09-27 MED ORDER — LIDOCAINE HCL (PF) 1 % IJ SOLN
INTRAMUSCULAR | Status: DC | PRN
  Administered 2017-09-27: 5 mL via SUBCUTANEOUS

## 2017-09-27 MED ORDER — FENTANYL CITRATE (PF) 100 MCG/2ML IJ SOLN
INTRAMUSCULAR | Status: AC
  Filled 2017-09-27: qty 2

## 2017-09-27 MED ORDER — DEXAMETHASONE SODIUM PHOSPHATE 10 MG/ML IJ SOLN
INTRAMUSCULAR | Status: DC | PRN
  Administered 2017-09-27: 10 mg via INTRAVENOUS

## 2017-09-27 MED ORDER — OXYCODONE HCL 5 MG PO TABS
5.0000 mg | ORAL_TABLET | ORAL | Status: DC | PRN
  Administered 2017-09-27: 5 mg via ORAL

## 2017-09-27 MED ORDER — CLINDAMYCIN PHOSPHATE 600 MG/50ML IV SOLN
INTRAVENOUS | Status: AC
Start: 2017-09-27 — End: 2017-09-27
  Filled 2017-09-27: qty 50

## 2017-09-27 MED ORDER — CEFAZOLIN SODIUM-DEXTROSE 2-4 GM/100ML-% IV SOLN
INTRAVENOUS | Status: AC
  Filled 2017-09-27: qty 100

## 2017-09-27 MED ORDER — PROPOFOL 500 MG/50ML IV EMUL
INTRAVENOUS | Status: DC | PRN
  Administered 2017-09-27: 120 ug/kg/min via INTRAVENOUS

## 2017-09-27 MED ORDER — MELOXICAM 7.5 MG PO TABS
15.0000 mg | ORAL_TABLET | ORAL | Status: AC
  Administered 2017-09-27: 15 mg via ORAL

## 2017-09-27 MED ORDER — PROPOFOL 500 MG/50ML IV EMUL
INTRAVENOUS | Status: AC
  Filled 2017-09-27: qty 50

## 2017-09-27 MED ORDER — CHLORHEXIDINE GLUCONATE CLOTH 2 % EX PADS: 6.0000 | MEDICATED_PAD | Freq: Once | CUTANEOUS | Status: DC

## 2017-09-27 MED ORDER — FENTANYL CITRATE (PF) 100 MCG/2ML IJ SOLN
25.0000 ug | INTRAMUSCULAR | Status: DC | PRN
  Administered 2017-09-27: 25 ug via INTRAVENOUS
  Administered 2017-09-27: 50 ug via INTRAVENOUS
  Administered 2017-09-27: 25 ug via INTRAVENOUS

## 2017-09-27 MED ORDER — OXYCODONE-ACETAMINOPHEN 5-325 MG PO TABS: 1.0000 | ORAL_TABLET | ORAL | Status: DC | PRN

## 2017-09-27 MED ORDER — MIDAZOLAM HCL 2 MG/2ML IJ SOLN
INTRAMUSCULAR | Status: AC
  Administered 2017-09-27: 1 mg via INTRAVENOUS
  Filled 2017-09-27: qty 2

## 2017-09-27 MED ORDER — BUPIVACAINE LIPOSOME 1.3 % IJ SUSP
INTRAMUSCULAR | Status: AC
  Filled 2017-09-27: qty 20

## 2017-09-27 MED ORDER — FAMOTIDINE 20 MG PO TABS
ORAL_TABLET | ORAL | Status: AC
  Administered 2017-09-27: 20 mg via ORAL
  Filled 2017-09-27: qty 1

## 2017-09-27 MED ORDER — LIDOCAINE HCL (PF) 1 % IJ SOLN
INTRAMUSCULAR | Status: AC
  Filled 2017-09-27: qty 5

## 2017-09-27 MED ORDER — ONDANSETRON HCL 4 MG/2ML IJ SOLN
INTRAMUSCULAR | Status: AC
  Filled 2017-09-27: qty 2

## 2017-09-27 MED ORDER — BUPIVACAINE LIPOSOME 1.3 % IJ SUSP
INTRAMUSCULAR | Status: DC | PRN
  Administered 2017-09-27: 20 mL via PERINEURAL

## 2017-09-27 MED ORDER — GABAPENTIN 400 MG PO CAPS: 400.0000 mg | ORAL_CAPSULE | ORAL | Status: DC

## 2017-09-27 MED ORDER — FENTANYL CITRATE (PF) 100 MCG/2ML IJ SOLN
INTRAMUSCULAR | Status: AC
  Administered 2017-09-27: 50 ug via INTRAVENOUS
  Filled 2017-09-27: qty 2

## 2017-09-27 MED ORDER — PROPOFOL 10 MG/ML IV BOLUS
INTRAVENOUS | Status: AC
  Filled 2017-09-27: qty 20

## 2017-09-27 MED ORDER — BUPIVACAINE HCL (PF) 0.5 % IJ SOLN
INTRAMUSCULAR | Status: DC | PRN
  Administered 2017-09-27: 10 mL via PERINEURAL

## 2017-09-27 MED ORDER — GABAPENTIN 400 MG PO CAPS: 400.0000 mg | ORAL_CAPSULE | Freq: Two times a day (BID) | ORAL | 3 refills | Status: DC

## 2017-09-27 SURGICAL SUPPLY — 36 items
BLADE SURG MINI STRL (BLADE) ×2 IMPLANT
BNDG COHESIVE 4X5 TAN STRL (GAUZE/BANDAGES/DRESSINGS) ×2 IMPLANT
BNDG ESMARK 4X12 TAN STRL LF (GAUZE/BANDAGES/DRESSINGS) ×2 IMPLANT
BUR 4X55 1 (BURR) ×2 IMPLANT
CANISTER SUCT 1200ML W/VALVE (MISCELLANEOUS) ×2 IMPLANT
CHLORAPREP W/TINT 26ML (MISCELLANEOUS) ×4 IMPLANT
CUFF TOURN 18 STER (MISCELLANEOUS) ×2 IMPLANT
CUFF TOURN 24 STER (MISCELLANEOUS) ×2 IMPLANT
DRAPE INCISE IOBAN 66X45 STRL (DRAPES) ×2 IMPLANT
ELECT REM PT RETURN 9FT ADLT (ELECTROSURGICAL) ×2
ELECTRODE REM PT RTRN 9FT ADLT (ELECTROSURGICAL) ×1 IMPLANT
GAUZE PETRO XEROFOAM 1X8 (MISCELLANEOUS) ×2 IMPLANT
GAUZE SPONGE 4X4 12PLY STRL (GAUZE/BANDAGES/DRESSINGS) ×2 IMPLANT
GLOVE SURG ORTHO 8.0 STRL STRW (GLOVE) ×2 IMPLANT
GOWN STRL REUS W/ TWL LRG LVL3 (GOWN DISPOSABLE) ×1 IMPLANT
GOWN STRL REUS W/TWL LRG LVL3 (GOWN DISPOSABLE) ×1
GOWN STRL REUS W/TWL LRG LVL4 (GOWN DISPOSABLE) ×2 IMPLANT
KIT TURNOVER KIT A (KITS) ×2 IMPLANT
NDL SPNL 20GX3.5 QUINCKE YW (NEEDLE) ×1 IMPLANT
NEEDLE SPNL 20GX3.5 QUINCKE YW (NEEDLE) ×2 IMPLANT
NS IRRIG 500ML POUR BTL (IV SOLUTION) ×2 IMPLANT
PACK EXTREMITY ARMC (MISCELLANEOUS) ×2 IMPLANT
PADDING CAST 4IN STRL (MISCELLANEOUS) ×3
PADDING CAST BLEND 4X4 STRL (MISCELLANEOUS) ×3 IMPLANT
PASSER SUT SWANSON 36MM LOOP (INSTRUMENTS) ×2 IMPLANT
SLING ARM LRG DEEP (SOFTGOODS) ×2 IMPLANT
SPLINT CAST 1 STEP 5X30 WHT (MISCELLANEOUS) ×2 IMPLANT
STAPLER SKIN PROX 35W (STAPLE) ×2 IMPLANT
STOCKINETTE BIAS CUT 6 980064 (GAUZE/BANDAGES/DRESSINGS) ×2 IMPLANT
STOCKINETTE IMPERVIOUS 9X36 MD (GAUZE/BANDAGES/DRESSINGS) ×2 IMPLANT
SUT FIBERWIRE #5 38 CONV BLUE (SUTURE) ×4
SUT VIC AB 2-0 SH 27 (SUTURE) ×1
SUT VIC AB 2-0 SH 27XBRD (SUTURE) ×1 IMPLANT
SUT VIC AB 3-0 SH 27 (SUTURE) ×1
SUT VIC AB 3-0 SH 27X BRD (SUTURE) ×1 IMPLANT
SUTURE FIBERWR #5 38 CONV BLUE (SUTURE) ×2 IMPLANT

## 2017-09-27 NOTE — Anesthesia Post-op Follow-up Note (Signed)
Anesthesia QCDR form completed.        

## 2017-09-27 NOTE — Anesthesia Postprocedure Evaluation (Signed)
Anesthesia Post Note  Patient: Patrick Robertson  Procedure(s) Performed: REINSERTION OF RUPTURED DISTAL BICEPS TENDON (Left Arm Upper)  Patient location during evaluation: PACU Anesthesia Type: General Level of consciousness: awake and alert Pain management: pain level controlled Vital Signs Assessment: post-procedure vital signs reviewed and stable Respiratory status: spontaneous breathing, nonlabored ventilation, respiratory function stable and patient connected to nasal cannula oxygen Cardiovascular status: blood pressure returned to baseline and stable Postop Assessment: no apparent nausea or vomiting Anesthetic complications: no     Last Vitals:  Vitals:   09/27/17 1635 09/27/17 1749  BP: (!) 149/97 (!) 146/91  Pulse: 95 88  Resp: 16 16  Temp: 36.5 C   SpO2: 98% 97%    Last Pain:  Vitals:   09/27/17 1749  TempSrc:   PainSc: 3                  Martha Clan

## 2017-09-27 NOTE — Op Note (Signed)
09/27/2017  3:23 PM  PATIENT:  Patrick Robertson    PRE-OPERATIVE DIAGNOSIS:  TEAR OF DISTAL TENDON OF BICEPS BRACHI LEFT  POST-OPERATIVE DIAGNOSIS:  Same  PROCEDURE:  REINSERTION OF RUPTURED DISTAL BICEPS TENDON LEFT  SURGEON:  Park Breed, MD   ANESTHESIA:   General  PREOPERATIVE INDICATIONS:  [name] is a  42 y.o. male with a diagnosis of TEAR OF DISTAL TENDON OF BICEPS BRACHI LEFT who failed conservative measures and elected for surgical management.    The risks benefits and alternatives were discussed with the patient preoperatively including but not limited to the risks of infection, bleeding, nerve injury, cardiopulmonary complications, the need for revision surgery, among others, and the patient was willing to proceed.  OPERATIVE IMPLANTS: None  OPERATIVE FINDINGS: Complete rupture of distal biceps tendon  EBL: None  TOURNIQUET TIME: 93  MIN  COMPLICATIONS:   None  OPERATIVE PROCEDURE: The patient was brought to the operating room and underwent satisfactory general anesthesia. The operative arm was prepped and draped in sterile fashion. Esmarch was applied and tourniquet inflated to 250 mmHg. An S-shaped incision was made crossing the elbow flexion crease. Dissection was carried out bluntly through subcutaneous tissues. Vessels were cauterized. Dissection proximally and medially revealed the retracted stump of the biceps tendon. This was freed up from adhesions and a #5 FiberWire suture was woven through the tendon multiple times. Kelly clamp was then directed through the interval between the radius and ulna until it was palpable posteriorly. A second incision was then made posterolaterally and dissection carried out down to the radius. Retractors were inserted to expose the bicipital tuberosity. Soft tissues were debrided away from here. A bur was used to freshen up the bone over the biceps tuberosity. Drill holes were then made volarly and dorsally. The  FiberWire suture with attached tendon was then pulled through the interval between the radius and ulna. The FiberWire sutures were then passed through the bone tunnels and with the elbow flexed the tendon was tied to the bicipital tuberosity snugly over a good bone bridge. This re-created excellent tension on the biceps. The wounds were both irrigated. Subcutaneous tissue and posterior fascia were closed with 2-0 Vicryl. Skin was closed with staples. 1/2% Marcaine was placed in all wounds. Dry sterile dressing and long-arm posterior splint were applied with the arm flexed to 90. This was taped snuggly and tourniquet deflated with good return of blood flow to the hand. A sling was applied. Patient was awakened and taken to recovery in good condition.  Earnestine Leys, MD

## 2017-09-27 NOTE — Anesthesia Preprocedure Evaluation (Signed)
Anesthesia Evaluation  Patient identified by MRN, date of birth, ID band Patient awake    Reviewed: Allergy & Precautions, H&P , NPO status , Patient's Chart, lab work & pertinent test results, reviewed documented beta blocker date and time   History of Anesthesia Complications (+) history of anesthetic complications (Urinary retention)  Airway Mallampati: III  TM Distance: >3 FB Neck ROM: full    Dental  (+) Dental Advidsory Given, Teeth Intact   Pulmonary neg pulmonary ROS,           Cardiovascular Exercise Tolerance: Good hypertension, (-) angina(-) CAD, (-) Past MI, (-) Cardiac Stents and (-) CABG (-) dysrhythmias (-) Valvular Problems/Murmurs     Neuro/Psych negative neurological ROS  negative psych ROS   GI/Hepatic negative GI ROS, Neg liver ROS,   Endo/Other  negative endocrine ROS  Renal/GU negative Renal ROS  negative genitourinary   Musculoskeletal   Abdominal   Peds  Hematology negative hematology ROS (+)   Anesthesia Other Findings Past Medical History: No date: Basal cell carcinoma (BCC) of chin No date: Hypertension   Reproductive/Obstetrics negative OB ROS                             Anesthesia Physical Anesthesia Plan  ASA: II  Anesthesia Plan: General   Post-op Pain Management:  Regional for Post-op pain   Induction: Intravenous  PONV Risk Score and Plan: 2 and Propofol infusion, Ondansetron and Dexamethasone  Airway Management Planned: Simple Face Mask  Additional Equipment:   Intra-op Plan:   Post-operative Plan:   Informed Consent: I have reviewed the patients History and Physical, chart, labs and discussed the procedure including the risks, benefits and alternatives for the proposed anesthesia with the patient or authorized representative who has indicated his/her understanding and acceptance.   Dental Advisory Given  Plan Discussed with:  Anesthesiologist, CRNA and Surgeon  Anesthesia Plan Comments:         Anesthesia Quick Evaluation

## 2017-09-27 NOTE — Anesthesia Procedure Notes (Signed)
Anesthesia Regional Block: Interscalene brachial plexus block   Pre-Anesthetic Checklist: ,, timeout performed, Correct Patient, Correct Site, Correct Laterality, Correct Procedure, Correct Position, site marked, Risks and benefits discussed,  Surgical consent,  Pre-op evaluation,  At surgeon's request and post-op pain management  Laterality: Left  Prep: alcohol swabs       Needles:  Injection technique: Single-shot  Needle Type: Stimiplex     Needle Length: 5cm  Needle Gauge: 22     Additional Needles:   Procedures:,,,, ultrasound used (permanent image in chart),,,,  Narrative:  Start time: 09/27/2017 1:05 PM End time: 09/27/2017 1:09 PM Injection made incrementally with aspirations every 5 mL.  Performed by: Personally  Anesthesiologist: Martha Clan, MD  Additional Notes: Functioning IV was confirmed and monitors were applied.  A 58mm 22ga Stimuplex needle was used. Sterile prep and drape,hand hygiene and sterile gloves were used.  Negative aspiration and negative test dose prior to incremental administration of local anesthetic. The patient tolerated the procedure well.

## 2017-09-27 NOTE — Transfer of Care (Signed)
Immediate Anesthesia Transfer of Care Note  Patient: Patrick Robertson  Procedure(s) Performed: REINSERTION OF RUPTURED DISTAL BICEPS TENDON (Left Arm Upper)  Patient Location: PACU  Anesthesia Type:General  Level of Consciousness: sedated  Airway & Oxygen Therapy: Patient connected to nasal cannula oxygen  Post-op Assessment: Post -op Vital signs reviewed and stable  Post vital signs: stable  Last Vitals:  Vitals Value Taken Time  BP 110/68 09/27/2017  3:29 PM  Temp    Pulse 80 09/27/2017  3:29 PM  Resp 16 09/27/2017  3:29 PM  SpO2 97 % 09/27/2017  3:29 PM  Vitals shown include unvalidated device data.  Last Pain:  Vitals:   09/27/17 1314  TempSrc:   PainSc: 0-No pain         Complications: No apparent anesthesia complications

## 2017-09-27 NOTE — H&P (Signed)
THE PATIENT WAS SEEN PRIOR TO SURGERY TODAY.  HISTORY, ALLERGIES, HOME MEDICATIONS AND OPERATIVE PROCEDURE WERE REVIEWED. RISKS AND BENEFITS OF SURGERY DISCUSSED WITH PATIENT AGAIN.  NO CHANGES FROM INITIAL HISTORY AND PHYSICAL NOTED.    

## 2017-09-27 NOTE — Discharge Instructions (Signed)
Interscalene Nerve Block with Exparel  1.  For your surgery you have received an Interscalene Nerve Block with Exparel. 2. Nerve Blocks affect many types of nerves, including nerves that control movement, pain and normal sensation.  You may experience feelings such as numbness, tingling, heaviness, weakness or the inability to move your arm or the feeling or sensation that your arm has "fallen asleep". 3. A nerve block with Exparel can last up to 5 days.  Usually the weakness wears off first.  The tingling and heaviness usually wear off next.  Finally you may start to notice pain.  Keep in mind that this may occur in any order.  Once a nerve block starts to wear off it is usually completely gone within 60 minutes. 4. ISNB may cause mild shortness of breath, a hoarse voice, blurry vision, unequal pupils, or drooping of the face on the same side as the nerve block.  These symptoms will usually resolve with the numbness.  Very rarely the procedure itself can cause mild seizures. 5. If needed, your surgeon will give you a prescription for pain medication.  It will take about 60 minutes for the oral pain medication to become fully effective.  So, it is recommended that you start taking this medication before the nerve block first begins to wear off, or when you first begin to feel discomfort. 6. Take your pain medication only as prescribed.  Pain medication can cause sedation and decrease your breathing if you take more than you need for the level of pain that you have. 7. Nausea is a common side effect of many pain medications.  You may want to eat something before taking your pain medicine to prevent nausea. 8. After an Interscalene nerve block, you cannot feel pain, pressure or extremes in temperature in the effected arm.  Because your arm is numb it is at an increased risk for injury.  To decrease the possibility of injury, please practice the following:  a. While you are awake change the position of  your arm frequently to prevent too much pressure on any one area for prolonged periods of time. b.  If you have a cast or tight dressing, check the color or your fingers every couple of hours.  Call your surgeon with the appearance of any discoloration (white or blue). c. If you are given a sling to wear before you go home, please wear it  at all times until the block has completely worn off.  Do not get up at night without your sling. d. Please contact [facility] or your surgeon if you do not begin to regain sensation after 7 days from the surgery.  Anesthesia may be contacted by calling the Same Day Surgery Department, Mon. through Fri., 6 am to 4 pm at [phone].   e. If you experience any other problems or concerns, please contact your surgeon's office. If you experience severe or prolonged shortness of breath go to the nearest emergency department.     AMBULATORY SURGERY  DISCHARGE INSTRUCTIONS   The drugs that you were given will stay in your system until tomorrow so for the next 24 hours you should not:  Drive an automobile Make any legal decisions Drink any alcoholic beverage   You may resume regular meals tomorrow.  Today it is better to start with liquids and gradually work up to solid foods.  You may eat anything you prefer, but it is better to start with liquids, then soup and crackers, and gradually  work up to [employer].   Please notify your doctor immediately if you have any unusual bleeding, trouble breathing, redness and pain at the surgery site, drainage, fever, or pain not relieved by medication.   Your post-operative visit with Dr.                                     is: Clovis Riley                Time:   Please call to schedule your post-operative visit.  Additional Instructions:

## 2017-09-28 ENCOUNTER — Encounter: Payer: Self-pay | Admitting: Specialist

## 2017-09-29 DIAGNOSIS — S46292D Other injury of muscle, fascia and tendon of other parts of biceps, left arm, subsequent encounter: Secondary | ICD-10-CM | POA: Diagnosis not present

## 2017-10-09 ENCOUNTER — Other Ambulatory Visit: Payer: Self-pay | Admitting: Internal Medicine

## 2017-10-11 DIAGNOSIS — S46292D Other injury of muscle, fascia and tendon of other parts of biceps, left arm, subsequent encounter: Secondary | ICD-10-CM | POA: Diagnosis not present

## 2017-10-11 NOTE — Telephone Encounter (Signed)
Probably needs appt for yearly visit

## 2017-10-11 NOTE — Telephone Encounter (Signed)
CPE reminder letter mailed 

## 2017-10-11 NOTE — Telephone Encounter (Signed)
Patrick Robertson Pt  Last filled 06-28-17 #30 Last OV 11-25-16 No Future OV

## 2017-10-24 ENCOUNTER — Other Ambulatory Visit: Payer: Self-pay | Admitting: Internal Medicine

## 2017-11-03 DIAGNOSIS — S46292D Other injury of muscle, fascia and tendon of other parts of biceps, left arm, subsequent encounter: Secondary | ICD-10-CM | POA: Diagnosis not present

## 2017-11-16 DIAGNOSIS — M25522 Pain in left elbow: Secondary | ICD-10-CM | POA: Diagnosis not present

## 2017-11-16 DIAGNOSIS — M25622 Stiffness of left elbow, not elsewhere classified: Secondary | ICD-10-CM | POA: Diagnosis not present

## 2017-11-21 IMAGING — US US ABDOMEN LIMITED
1 series · 14 of 25 positions shown · non-contrast
Comparison: None in PACs

CLINICAL DATA: Elevated liver enzymes, fever, fatigue for the past
3 weeks.

EXAM:
ULTRASOUND ABDOMEN LIMITED RIGHT UPPER QUADRANT

[Series 1: us abdomen limited · 0.20mm/px · 14 of 56 slices shown]
[im 1/56]
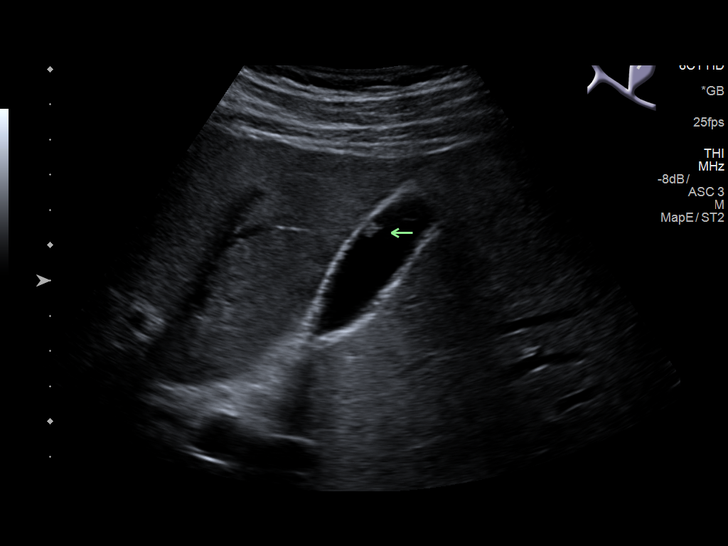
[im 5/56]
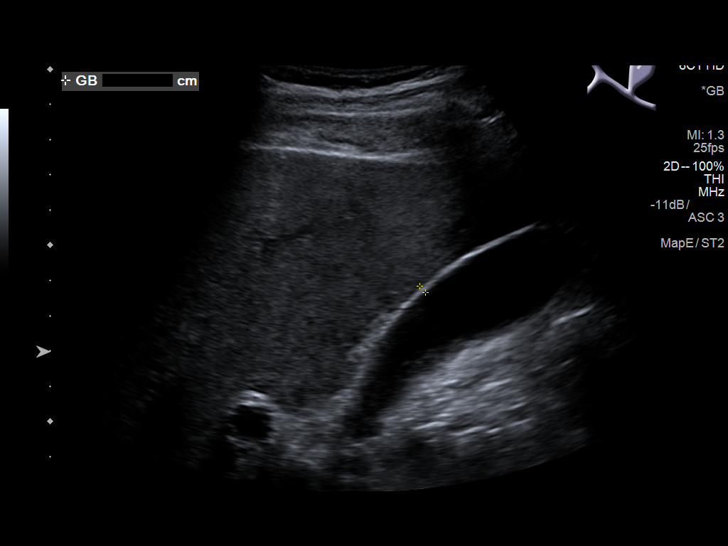
[im 10/56]
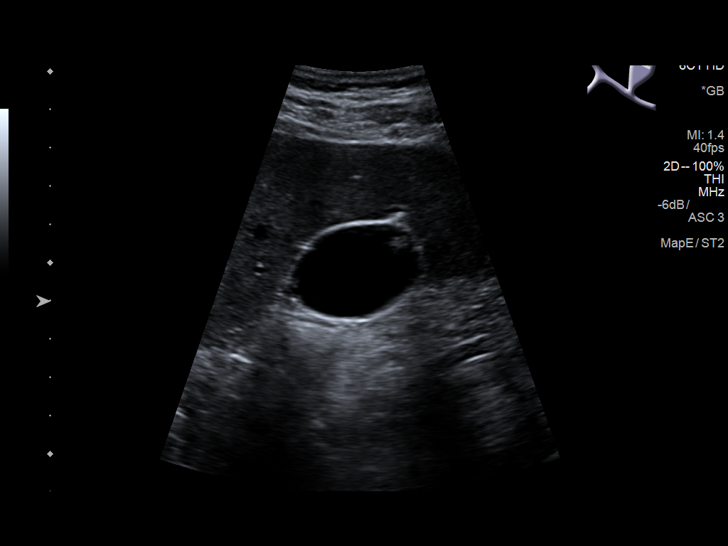
[im 14/56]
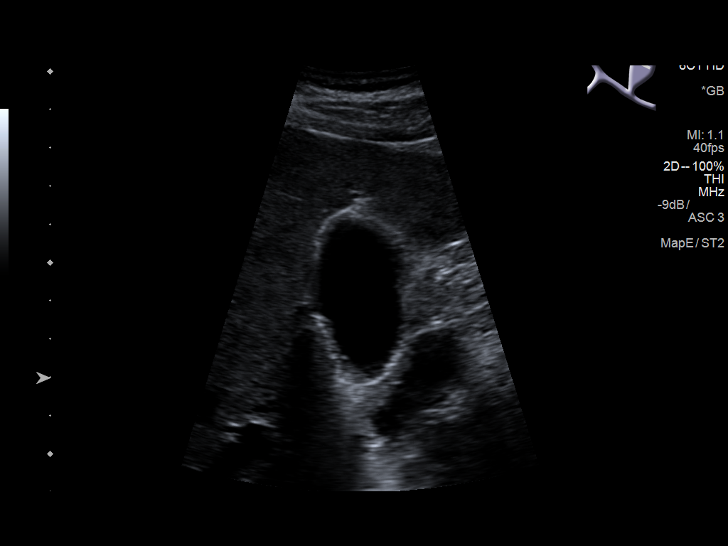
[im 19/56]
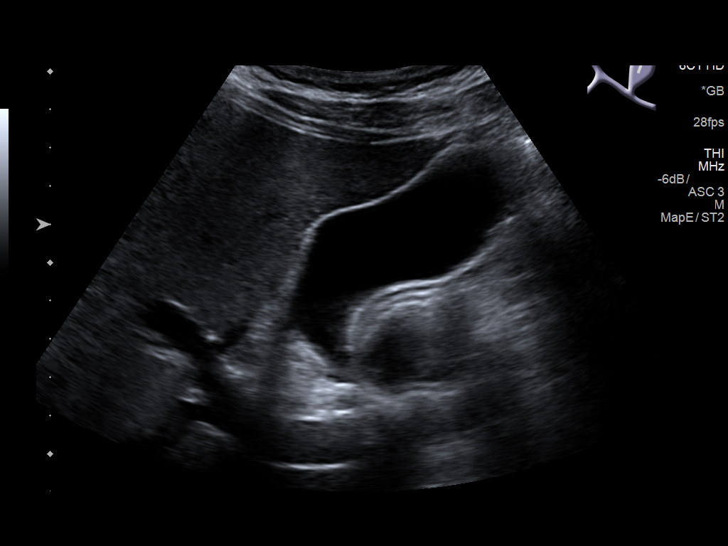
[im 21/56]
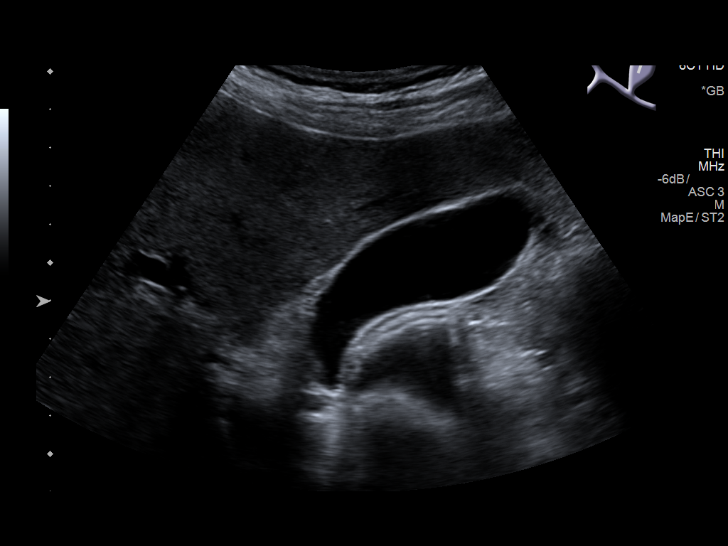
[im 26/56]
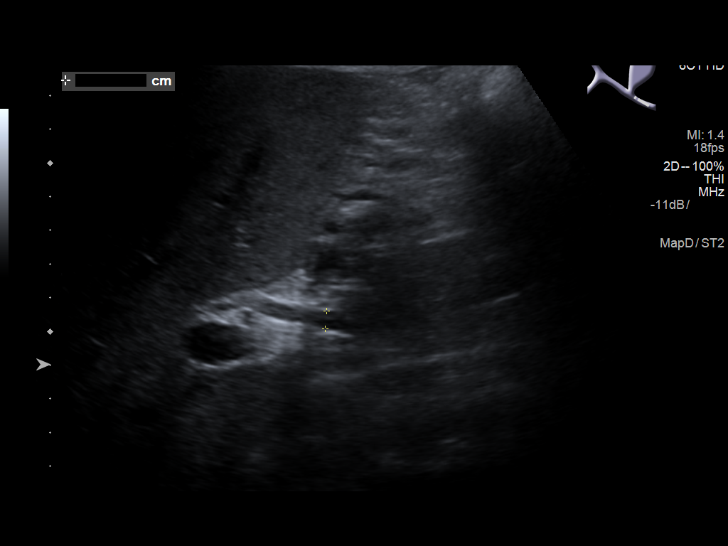
[im 30/56]
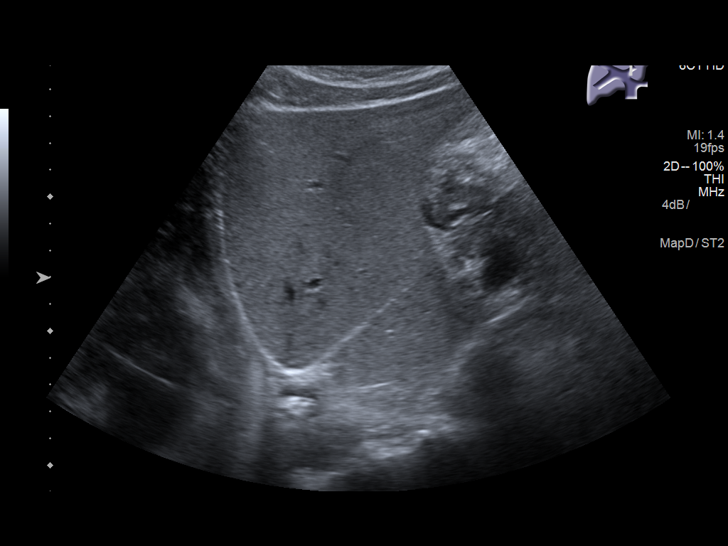
[im 35/56]
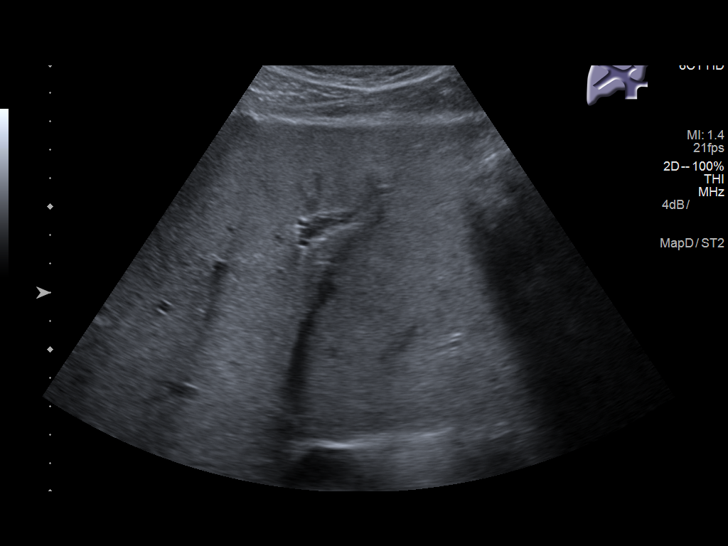
[im 37/56]
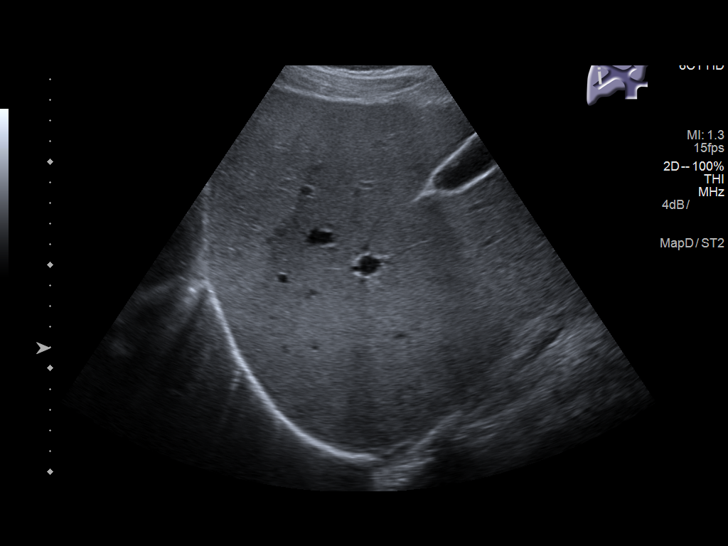
[im 42/56]
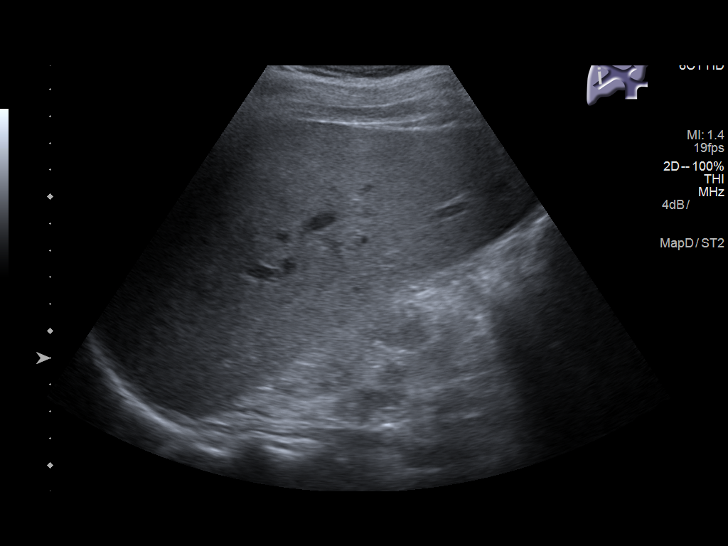
[im 46/56]
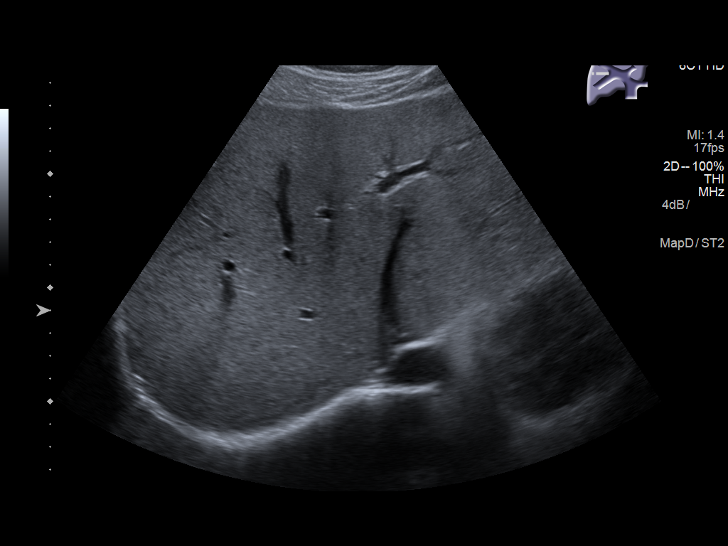
[im 51/56]
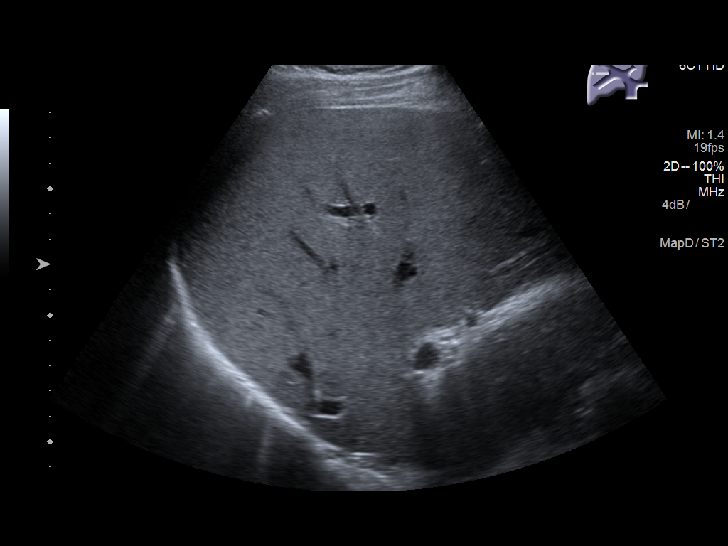
[im 56/56]
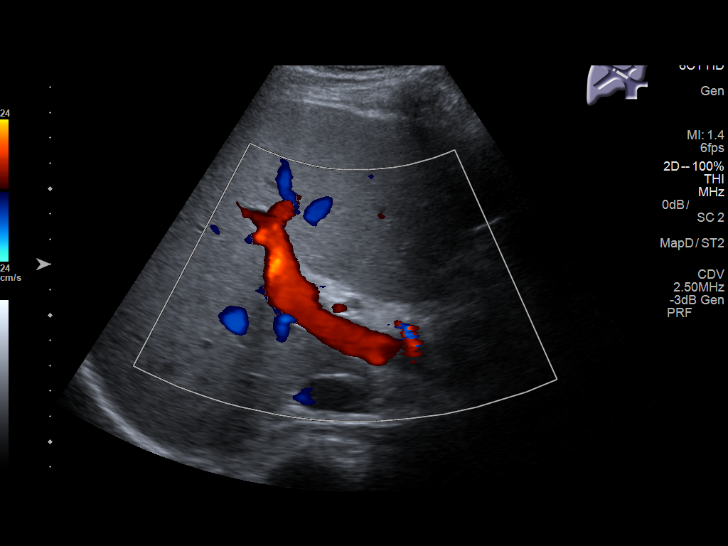

[14 of 25 positions shown; findings below may reference images not displayed]

FINDINGS: Gallbladder:

The gallbladder is adequately distended. There are echogenic
nonshadowing non mobile foci adherent to the mucosal surface
compatible with polyps with the largest measuring 5 mm in diameter.
No calcified stones are observed. There is no gallbladder wall
thickening or pericholecystic fluid or positive sonographic Murphy's
sign.

Common bile duct:

Diameter: 3.4- 5.3 mm. No intraluminal stones or sludge are
observed.

Liver:

The hepatic echotexture is mildly increased. The surface contour is
smooth. There is no focal mass or ductal dilation.
IMPRESSION: Gallbladder polyps. No evidence of stones or sonographic evidence of
acute cholecystitis. If there are clinical concerns of gallbladder
dysfunction, a nuclear medicine hepatobiliary scan with gallbladder
ejection fraction determination may be useful.

Mildly increased hepatic echotexture likely reflects fatty
infiltrative change.

## 2017-11-26 DIAGNOSIS — M25622 Stiffness of left elbow, not elsewhere classified: Secondary | ICD-10-CM | POA: Diagnosis not present

## 2017-11-26 DIAGNOSIS — M25522 Pain in left elbow: Secondary | ICD-10-CM | POA: Diagnosis not present

## 2017-12-03 DIAGNOSIS — M25622 Stiffness of left elbow, not elsewhere classified: Secondary | ICD-10-CM | POA: Diagnosis not present

## 2017-12-03 DIAGNOSIS — M25522 Pain in left elbow: Secondary | ICD-10-CM | POA: Diagnosis not present

## 2017-12-04 ENCOUNTER — Other Ambulatory Visit: Payer: Self-pay | Admitting: Internal Medicine

## 2017-12-06 NOTE — Telephone Encounter (Signed)
Last filled 10/11/2017 #30... OV scheduled for 12/2017... Please advise

## 2017-12-08 DIAGNOSIS — M25622 Stiffness of left elbow, not elsewhere classified: Secondary | ICD-10-CM | POA: Diagnosis not present

## 2017-12-08 DIAGNOSIS — M25522 Pain in left elbow: Secondary | ICD-10-CM | POA: Diagnosis not present

## 2017-12-22 ENCOUNTER — Encounter: Payer: Self-pay | Admitting: Internal Medicine

## 2017-12-22 ENCOUNTER — Ambulatory Visit (INDEPENDENT_AMBULATORY_CARE_PROVIDER_SITE_OTHER): Payer: BLUE CROSS/BLUE SHIELD | Admitting: Internal Medicine

## 2017-12-22 VITALS — BP 132/84 | HR 69 | Temp 98.7°F | Ht 67.25 in | Wt 183.0 lb

## 2017-12-22 DIAGNOSIS — E782 Mixed hyperlipidemia: Secondary | ICD-10-CM

## 2017-12-22 DIAGNOSIS — R7989 Other specified abnormal findings of blood chemistry: Secondary | ICD-10-CM | POA: Diagnosis not present

## 2017-12-22 DIAGNOSIS — F5102 Adjustment insomnia: Secondary | ICD-10-CM

## 2017-12-22 DIAGNOSIS — I10 Essential (primary) hypertension: Secondary | ICD-10-CM

## 2017-12-22 DIAGNOSIS — Z0000 Encounter for general adult medical examination without abnormal findings: Secondary | ICD-10-CM | POA: Diagnosis not present

## 2017-12-22 DIAGNOSIS — N469 Male infertility, unspecified: Secondary | ICD-10-CM | POA: Diagnosis not present

## 2017-12-22 DIAGNOSIS — M25522 Pain in left elbow: Secondary | ICD-10-CM | POA: Diagnosis not present

## 2017-12-22 DIAGNOSIS — M25622 Stiffness of left elbow, not elsewhere classified: Secondary | ICD-10-CM | POA: Diagnosis not present

## 2017-12-22 NOTE — Progress Notes (Signed)
Subjective:    Patient ID: Patrick Robertson, male    DOB: 10-Feb-1976, 42 y.o.   MRN: [id number]  HPI  Pt presents to the clinic today for his annual exam. He is also due to follow up chronic conditions.  HTN: His BP today is 132/84. He is taking Lisinopril HCT as prescribed. There is no ECG on file.  Insomnia: He takes Ambien on nights that he works only.  Male Infertility: He is taking Testosterone injections. He is being followed by Dr. Michaelle Copas at [facility].  Flu: 12/2017 Tetanus: 04/2010 Vision Screening: annually Dentist: biannually  Diet: He does eat meat. He consumes fruits and veggies daily. He occasionally eats fried foods. He drinks mostly water and coffee. Exercise: None  Review of Systems      Past Medical History:  Diagnosis Date  . Basal cell carcinoma (BCC) of chin   . Hypertension     Current Outpatient Medications  Medication Sig Dispense Refill  . lisinopril-hydrochlorothiazide (PRINZIDE,ZESTORETIC) 20-12.5 MG tablet Take 1 tablet by mouth daily. 90 tablet 3  . Multiple Vitamin (MULTIVITAMIN) tablet Take 1 tablet by mouth daily.    Marland Kitchen testosterone enanthate (DELATESTRYL) 200 MG/ML injection INJECT 0.5 ML (100 MG TOTAL) INTO THE MUSCLE ONCE A WEEK. 5 mL 2  . zolpidem (AMBIEN) 5 MG tablet TAKE 1 TABLET BY MOUTH EVERY DAY AT BEDTIME AS NEEDED 15 tablet 1   No current facility-administered medications for this visit.     No Known Allergies  Family History  Problem Relation Age of Onset  . Hypertension Mother   . Hypertension Father   . Hypertension Maternal Grandmother   . Hypertension Maternal Grandfather   . Cancer Maternal Grandfather   . Hypertension Paternal Grandmother   . Hypertension Paternal Grandfather   . Cancer Paternal Grandfather   . Hypertension Brother     Social History   Socioeconomic History  . Marital status: Married    Spouse name: Not on file  . Number of children: Not on file  . Years of education: Not on file  . Highest  education level: Not on file  Occupational History  . Not on file  Social Needs  . Financial resource strain: Not on file  . Food insecurity:    Worry: Not on file    Inability: Not on file  . Transportation needs:    Medical: Not on file    Non-medical: Not on file  Tobacco Use  . Smoking status: Never Smoker  . Smokeless tobacco: Never Used  Substance and Sexual Activity  . Alcohol use: No    Alcohol/week: 1.0 standard drinks    Types: 1 Cans of beer per week  . Drug use: No  . Sexual activity: Yes  Lifestyle  . Physical activity:    Days per week: Not on file    Minutes per session: Not on file  . Stress: Not on file  Relationships  . Social connections:    Talks on phone: Not on file    Gets together: Not on file    Attends religious service: Not on file    Active member of club or organization: Not on file    Attends meetings of clubs or organizations: Not on file    Relationship status: Not on file  . Intimate partner violence:    Fear of current or ex partner: Not on file    Emotionally abused: Not on file    Physically abused: Not on file  Forced sexual activity: Not on file  Other Topics Concern  . Not on file  Social History Narrative  . Not on file     Constitutional: Denies fever, malaise, fatigue, headache or abrupt weight changes.  HEENT: Denies eye pain, eye redness, ear pain, ringing in the ears, wax buildup, runny nose, nasal congestion, bloody nose, or sore throat. Respiratory: Denies difficulty breathing, shortness of breath, cough or sputum production.   Cardiovascular: Denies chest pain, chest tightness, palpitations or swelling in the hands or feet.  Gastrointestinal: Denies abdominal pain, bloating, constipation, diarrhea or blood in the stool.  GU: Denies urgency, frequency, pain with urination, burning sensation, blood in urine, odor or discharge. Musculoskeletal: Pt reports weakness of LUE (biceps tendon rupture summer 2019). Denies  decrease in range of motion, difficulty with gait, muscle pain or joint pain and swelling.  Skin: Denies redness, rashes, lesions or ulcercations.  Neurological: Pt reports insomnia. Denies dizziness, difficulty with memory, difficulty with speech or problems with balance and coordination.  Psych: Denies anxiety, depression, SI/HI.  No other specific complaints in a complete review of systems (except as listed in HPI above).  Objective:   Physical Exam   BP 132/84   Pulse 69   Temp 98.7 F (37.1 C) (Oral)   Ht 5' 7.25" (1.708 m)   Wt 183 lb (83 kg)   SpO2 99%   BMI 28.45 kg/m  Wt Readings from Last 3 Encounters:  12/22/17 183 lb (83 kg)  09/24/17 184 lb 14.4 oz (83.9 kg)  11/25/16 184 lb (83.5 kg)    General: Appears his stated age, well developed, well nourished in NAD. Skin: Warm, dry and intact.  HEENT: Head: normal shape and size; Eyes: sclera white, no icterus, conjunctiva pink, PERRLA and EOMs intact; Ears: Tm's gray and intact, normal light reflex;  Throat/Mouth: Teeth present, mucosa pink and moist, no exudate, lesions or ulcerations noted.  Neck:  Neck supple, trachea midline. No masses, lumps or thyromegaly present.  Cardiovascular: Normal rate and rhythm. S1,S2 noted.  No murmur, rubs or gallops noted. No JVD or BLE edema.  Pulmonary/Chest: Normal effort and positive vesicular breath sounds. No respiratory distress. No wheezes, rales or ronchi noted.  Abdomen: Soft and nontender. Normal bowel sounds. No distention or masses noted. Liver, spleen and kidneys non palpable. Musculoskeletal: Strength 5/5 BUE/BLE. No difficulty with gait.  Neurological: Alert and oriented. Cranial nerves II-XII grossly intact. Coordination normal.  Psychiatric: Mood and affect normal. Behavior is normal. Judgment and thought content normal.     BMET    Component Value Date/Time   NA 142 12/10/2016 1434   K 4.0 12/10/2016 1434   CL 104 12/10/2016 1434   CO2 33 (H) 12/10/2016 1434    GLUCOSE 92 12/10/2016 1434   BUN 14 12/10/2016 1434   CREATININE 1.16 12/10/2016 1434   CALCIUM 9.3 12/10/2016 1434    Lipid Panel     Component Value Date/Time   CHOL 131 10/07/2016 1509   TRIG 251.0 (H) 10/07/2016 1509   HDL 20.80 (L) 10/07/2016 1509   CHOLHDL 6 10/07/2016 1509   VLDL 50.2 (H) 10/07/2016 1509   LDLCALC 69 10/04/2015 1505    CBC    Component Value Date/Time   WBC 6.2 12/10/2016 1434   RBC 4.81 12/10/2016 1434   HGB 14.4 12/10/2016 1434   HCT 42.7 12/10/2016 1434   PLT 210.0 12/10/2016 1434   MCV 88.7 12/10/2016 1434   MCHC 33.8 12/10/2016 1434   RDW 13.8 12/10/2016  1434   LYMPHSABS 3.0 12/10/2016 1434   MONOABS 0.5 12/10/2016 1434   EOSABS 0.1 12/10/2016 1434   BASOSABS 0.1 12/10/2016 1434    Hgb A1C No results found for: HGBA1C         Assessment & Plan:   Preventative Health Maintenance:  He already had his flu shot  Tetanus UTD Encouraged him to consume a balanced diet and exercise regimen Advised him to see an eye doctor and dentist annually Will check CBC, CMET, Lipid, TSH today  RTC in 1 year, sooner if needed Webb Silversmith, NP

## 2017-12-22 NOTE — Assessment & Plan Note (Signed)
Testosterone future ordered Advised him to make a lab only appt to have this checked prior to 10 am

## 2017-12-22 NOTE — Assessment & Plan Note (Signed)
Borderline, but I'm hesitant to increase dose of Lisinopril HCT at this time Reinforced DASH diet CBC and CMET today

## 2017-12-22 NOTE — Patient Instructions (Signed)

## 2017-12-22 NOTE — Assessment & Plan Note (Signed)
Continue Ambien prn  

## 2017-12-23 LAB — CBC
HCT: 45.7 % (ref 39.0–52.0)
Hemoglobin: 16.1 g/dL (ref 13.0–17.0)
MCHC: 35.2 g/dL (ref 30.0–36.0)
MCV: 85.5 fl (ref 78.0–100.0)
Platelets: 266 10*3/uL (ref 150.0–400.0)
RBC: 5.34 Mil/uL (ref 4.22–5.81)
RDW: 13.1 % (ref 11.5–15.5)
WBC: 8.9 10*3/uL (ref 4.0–10.5)

## 2017-12-23 LAB — LIPID PANEL
Cholesterol: 120 mg/dL (ref 0–200)
HDL: 18.2 mg/dL — ABNORMAL LOW (ref >39.00)
NONHDL: 101.66
Total CHOL/HDL Ratio: 7
Triglycerides: 259 mg/dL — ABNORMAL HIGH (ref 0.0–149.0)
VLDL: 51.8 mg/dL — ABNORMAL HIGH (ref 0.0–40.0)

## 2017-12-23 LAB — TSH: TSH: 1.26 u[IU]/mL (ref 0.35–4.50)

## 2017-12-23 LAB — COMPREHENSIVE METABOLIC PANEL
ALK PHOS: 67 U/L (ref 39–117)
ALT: 25 U/L (ref 0–53)
AST: 24 U/L (ref 0–37)
Albumin: 4.7 g/dL (ref 3.5–5.2)
BILIRUBIN TOTAL: 1.4 mg/dL — AB (ref 0.2–1.2)
BUN: 16 mg/dL (ref 6–23)
CO2: 30 meq/L (ref 19–32)
CREATININE: 1.06 mg/dL (ref 0.40–1.50)
Calcium: 9.6 mg/dL (ref 8.4–10.5)
Chloride: 98 mEq/L (ref 96–112)
GFR: 80.89 mL/min (ref >60.00)
GLUCOSE: 79 mg/dL (ref 70–99)
Potassium: 3.8 mEq/L (ref 3.5–5.1)
SODIUM: 137 meq/L (ref 135–145)
TOTAL PROTEIN: 7.7 g/dL (ref 6.0–8.3)

## 2017-12-23 LAB — LDL CHOLESTEROL, DIRECT: LDL DIRECT: 80 mg/dL

## 2017-12-24 ENCOUNTER — Telehealth: Payer: Self-pay

## 2017-12-24 ENCOUNTER — Other Ambulatory Visit (INDEPENDENT_AMBULATORY_CARE_PROVIDER_SITE_OTHER): Payer: BLUE CROSS/BLUE SHIELD

## 2017-12-24 DIAGNOSIS — R7989 Other specified abnormal findings of blood chemistry: Secondary | ICD-10-CM | POA: Diagnosis not present

## 2017-12-24 LAB — TESTOSTERONE: TESTOSTERONE: 428.3 ng/dL (ref 300.00–890.00)

## 2017-12-24 NOTE — Addendum Note (Signed)
Addended by: Lurlean Nanny on: 12/24/2017 01:43 PM   Modules accepted: Orders

## 2017-12-29 DIAGNOSIS — M25622 Stiffness of left elbow, not elsewhere classified: Secondary | ICD-10-CM | POA: Diagnosis not present

## 2017-12-29 DIAGNOSIS — M25522 Pain in left elbow: Secondary | ICD-10-CM | POA: Diagnosis not present

## 2018-01-01 ENCOUNTER — Other Ambulatory Visit: Payer: Self-pay | Admitting: Internal Medicine

## 2018-01-03 NOTE — Telephone Encounter (Signed)
Last Rx 8/26 #15 1r. Last ov 12/22/2017

## 2018-01-14 DIAGNOSIS — M25522 Pain in left elbow: Secondary | ICD-10-CM | POA: Diagnosis not present

## 2018-01-14 DIAGNOSIS — M25622 Stiffness of left elbow, not elsewhere classified: Secondary | ICD-10-CM | POA: Diagnosis not present

## 2018-01-17 ENCOUNTER — Other Ambulatory Visit: Payer: Self-pay | Admitting: Internal Medicine

## 2018-01-17 MED ORDER — ZOLPIDEM TARTRATE 5 MG PO TABS: 5.0000 mg | ORAL_TABLET | Freq: Every day | ORAL | 0 refills | Status: DC

## 2018-01-17 NOTE — Addendum Note (Signed)
Addended by: Lurlean Nanny on: 01/17/2018 11:19 AM   Modules accepted: Orders

## 2018-01-17 NOTE — Telephone Encounter (Signed)
Copied from [place] [id number]. Topic: Quick Communication - See Telephone Encounter >> Jan 17, 2018  3:12 PM Hewitt Shorts wrote: Pt is needing a 30 day for ambien at [facility] 5 mg  Best number [phone]

## 2018-01-17 NOTE — Telephone Encounter (Signed)
This needs to be sent as a RX request, not a patient phone call.

## 2018-01-17 NOTE — Telephone Encounter (Signed)
When I would try to change to a med refill request this message would pop up in middle of screen; The following medications are ordered in a future encounter, so you cannot change them:  zolpidem (AMBIEN) 5 MG tablet  I went to Webb Silversmith NP and she ordered herself. Not sure why this would not let me convert to a refill request.

## 2018-01-18 ENCOUNTER — Other Ambulatory Visit: Payer: Self-pay | Admitting: Internal Medicine

## 2018-01-24 ENCOUNTER — Telehealth: Payer: Self-pay

## 2018-01-24 DIAGNOSIS — M25622 Stiffness of left elbow, not elsewhere classified: Secondary | ICD-10-CM | POA: Diagnosis not present

## 2018-01-24 DIAGNOSIS — M25522 Pain in left elbow: Secondary | ICD-10-CM | POA: Diagnosis not present

## 2018-01-24 NOTE — Telephone Encounter (Signed)
Ambien PA has been submitted via [email or web address] awaiting response

## 2018-02-15 DIAGNOSIS — F411 Generalized anxiety disorder: Secondary | ICD-10-CM | POA: Diagnosis not present

## 2018-03-02 DIAGNOSIS — D225 Melanocytic nevi of trunk: Secondary | ICD-10-CM | POA: Diagnosis not present

## 2018-03-02 DIAGNOSIS — L821 Other seborrheic keratosis: Secondary | ICD-10-CM | POA: Diagnosis not present

## 2018-03-02 DIAGNOSIS — D1801 Hemangioma of skin and subcutaneous tissue: Secondary | ICD-10-CM | POA: Diagnosis not present

## 2018-03-02 DIAGNOSIS — L218 Other seborrheic dermatitis: Secondary | ICD-10-CM | POA: Diagnosis not present

## 2018-03-03 DIAGNOSIS — F411 Generalized anxiety disorder: Secondary | ICD-10-CM | POA: Diagnosis not present

## 2018-03-18 ENCOUNTER — Other Ambulatory Visit: Payer: Self-pay | Admitting: Internal Medicine

## 2018-03-18 NOTE — Telephone Encounter (Signed)
Last filled 01/17/2018... Please advise

## 2018-03-25 ENCOUNTER — Other Ambulatory Visit (INDEPENDENT_AMBULATORY_CARE_PROVIDER_SITE_OTHER): Payer: BLUE CROSS/BLUE SHIELD

## 2018-03-25 ENCOUNTER — Other Ambulatory Visit: Payer: Self-pay | Admitting: Internal Medicine

## 2018-03-25 DIAGNOSIS — E782 Mixed hyperlipidemia: Secondary | ICD-10-CM

## 2018-03-25 LAB — LIPID PANEL
CHOL/HDL RATIO: 6
CHOLESTEROL: 130 mg/dL (ref 0–200)
HDL: 21.4 mg/dL — ABNORMAL LOW (ref >39.00)
NONHDL: 108.26
TRIGLYCERIDES: 279 mg/dL — AB (ref 0.0–149.0)
VLDL: 55.8 mg/dL — ABNORMAL HIGH (ref 0.0–40.0)

## 2018-03-25 LAB — LDL CHOLESTEROL, DIRECT: Direct LDL: 90 mg/dL

## 2018-03-29 DIAGNOSIS — F411 Generalized anxiety disorder: Secondary | ICD-10-CM | POA: Diagnosis not present

## 2018-03-29 NOTE — Telephone Encounter (Signed)
Last filled 09/2017... last OV and Testosterone lab was 12/2017... please advise

## 2018-05-11 ENCOUNTER — Other Ambulatory Visit: Payer: Self-pay | Admitting: Internal Medicine

## 2018-05-12 NOTE — Telephone Encounter (Signed)
Last filled 03/29/18... last Testosterone lab check was 12/2017 428.3  Please advise

## 2018-05-12 NOTE — Telephone Encounter (Signed)
Last filled 03/18/18...Marland Kitchen please advise

## 2018-06-19 ENCOUNTER — Other Ambulatory Visit: Payer: Self-pay | Admitting: Internal Medicine

## 2018-06-20 NOTE — Telephone Encounter (Signed)
Last filled 05/13/18... please advise 

## 2018-06-21 DIAGNOSIS — F411 Generalized anxiety disorder: Secondary | ICD-10-CM | POA: Diagnosis not present

## 2018-07-24 ENCOUNTER — Other Ambulatory Visit: Payer: Self-pay | Admitting: Internal Medicine

## 2018-07-24 NOTE — Telephone Encounter (Signed)
Last filled 06/20/2018... please advise

## 2018-07-29 ENCOUNTER — Other Ambulatory Visit: Payer: Self-pay | Admitting: Internal Medicine

## 2018-07-29 NOTE — Telephone Encounter (Signed)
Last filled 05/13/18... please advise... last CPE and labs 12/2017

## 2018-09-12 ENCOUNTER — Other Ambulatory Visit: Payer: Self-pay | Admitting: Internal Medicine

## 2018-09-12 ENCOUNTER — Telehealth: Payer: Self-pay

## 2018-09-12 DIAGNOSIS — E782 Mixed hyperlipidemia: Secondary | ICD-10-CM

## 2018-09-12 NOTE — Telephone Encounter (Signed)
Left detailed VM w COVID screen and curbside info   

## 2018-09-14 ENCOUNTER — Other Ambulatory Visit (INDEPENDENT_AMBULATORY_CARE_PROVIDER_SITE_OTHER): Payer: BC Managed Care – PPO

## 2018-09-14 DIAGNOSIS — E782 Mixed hyperlipidemia: Secondary | ICD-10-CM

## 2018-09-14 LAB — LIPID PANEL
Cholesterol: 117 mg/dL (ref 0–200)
HDL: 20.2 mg/dL — ABNORMAL LOW (ref >39.00)
NonHDL: 96.72
Total CHOL/HDL Ratio: 6
Triglycerides: 216 mg/dL — ABNORMAL HIGH (ref 0.0–149.0)
VLDL: 43.2 mg/dL — ABNORMAL HIGH (ref 0.0–40.0)

## 2018-09-14 LAB — LDL CHOLESTEROL, DIRECT: Direct LDL: 68 mg/dL

## 2018-09-25 ENCOUNTER — Other Ambulatory Visit: Payer: Self-pay | Admitting: Internal Medicine

## 2018-09-26 NOTE — Telephone Encounter (Signed)
Last filled 07/25/2018... please advise

## 2018-10-31 ENCOUNTER — Other Ambulatory Visit: Payer: Self-pay | Admitting: Internal Medicine

## 2018-11-07 NOTE — Telephone Encounter (Signed)
Last filled 07/29/2018, pt has upcoming appt in Sept... please advise

## 2018-11-28 ENCOUNTER — Other Ambulatory Visit: Payer: Self-pay | Admitting: Internal Medicine

## 2018-11-30 MED ORDER — LISINOPRIL-HYDROCHLOROTHIAZIDE 20-12.5 MG PO TABS: 1.0000 | ORAL_TABLET | Freq: Every day | ORAL | 0 refills | Status: DC

## 2018-11-30 NOTE — Addendum Note (Signed)
Addended by: Lurlean Nanny on: 11/30/2018 01:38 PM   Modules accepted: Orders

## 2018-12-12 ENCOUNTER — Other Ambulatory Visit: Payer: Self-pay | Admitting: Internal Medicine

## 2018-12-14 NOTE — Telephone Encounter (Signed)
Last filled 09/27/2018, pt has appt scheduled for this month

## 2018-12-30 ENCOUNTER — Encounter: Payer: BLUE CROSS/BLUE SHIELD | Admitting: Internal Medicine

## 2019-01-06 ENCOUNTER — Ambulatory Visit: Payer: BC Managed Care – PPO | Admitting: Internal Medicine

## 2019-01-06 DIAGNOSIS — Z0289 Encounter for other administrative examinations: Secondary | ICD-10-CM

## 2019-01-06 NOTE — Progress Notes (Signed)
Subjective:    Patient ID: Patrick Robertson, male    DOB: Jul 14, 1975, 43 y.o.   MRN: [id number]  HPI  Pt presents to the clinic today for his annual exam. He is also due to follow up chronic conditions.  HTN: His BP today is. He is taking Lisinopril HCT as prescribed. There is no ECG on file.  Insomnia: He takes Ambien only on the nights he works. It helps him to fall asleep and stay asleep. There is no sleep study on file.   Male Infertility: He is taking Testosterone injections prescribed by Dr. Michaelle Copas and [facility].  Flu: 12/2017 Tetanus: 04/2010 Vision Screening: Dentist:  Diet: Exercise:  Review of Systems      Past Medical History:  Diagnosis Date  . Basal cell carcinoma (BCC) of chin   . Hypertension     Current Outpatient Medications  Medication Sig Dispense Refill  . lisinopril-hydrochlorothiazide (ZESTORETIC) 20-12.5 MG tablet Take 1 tablet by mouth daily. 90 tablet 0  . Multiple Vitamin (MULTIVITAMIN) tablet Take 1 tablet by mouth daily.    Marland Kitchen testosterone enanthate (DELATESTRYL) 200 MG/ML injection INJECT 0.5 ML INTO THE MUSCLE ONCE A WEEK. 5 mL 0  . zolpidem (AMBIEN) 5 MG tablet TAKE 1 TABLET BY MOUTH EVERYDAY AT BEDTIME 30 tablet 0   No current facility-administered medications for this visit.     No Known Allergies  Family History  Problem Relation Age of Onset  . Hypertension Mother   . Hypertension Father   . Hypertension Maternal Grandmother   . Hypertension Maternal Grandfather   . Cancer Maternal Grandfather   . Hypertension Paternal Grandmother   . Hypertension Paternal Grandfather   . Cancer Paternal Grandfather   . Hypertension Brother     Social History   Socioeconomic History  . Marital status: Married    Spouse name: Not on file  . Number of children: Not on file  . Years of education: Not on file  . Highest education level: Not on file  Occupational History  . Not on file  Social Needs  . Financial resource strain: Not on  file  . Food insecurity    Worry: Not on file    Inability: Not on file  . Transportation needs    Medical: Not on file    Non-medical: Not on file  Tobacco Use  . Smoking status: Never Smoker  . Smokeless tobacco: Never Used  Substance and Sexual Activity  . Alcohol use: No    Alcohol/week: 1.0 standard drinks    Types: 1 Cans of beer per week  . Drug use: No  . Sexual activity: Yes  Lifestyle  . Physical activity    Days per week: Not on file    Minutes per session: Not on file  . Stress: Not on file  Relationships  . Social Herbalist on phone: Not on file    Gets together: Not on file    Attends religious service: Not on file    Active member of club or organization: Not on file    Attends meetings of clubs or organizations: Not on file    Relationship status: Not on file  . Intimate partner violence    Fear of current or ex partner: Not on file    Emotionally abused: Not on file    Physically abused: Not on file    Forced sexual activity: Not on file  Other Topics Concern  . Not on file  Social  History Narrative  . Not on file     Constitutional: Denies fever, malaise, fatigue, headache or abrupt weight changes.  HEENT: Denies eye pain, eye redness, ear pain, ringing in the ears, wax buildup, runny nose, nasal congestion, bloody nose, or sore throat. Respiratory: Denies difficulty breathing, shortness of breath, cough or sputum production.   Cardiovascular: Denies chest pain, chest tightness, palpitations or swelling in the hands or feet.  Gastrointestinal: Denies abdominal pain, bloating, constipation, diarrhea or blood in the stool.  GU: Denies urgency, frequency, pain with urination, burning sensation, blood in urine, odor or discharge. Musculoskeletal: Denies decrease in range of motion, difficulty with gait, muscle pain or joint pain and swelling.  Skin: Denies redness, rashes, lesions or ulcercations.  Neurological: Denies dizziness, difficulty  with memory, difficulty with speech or problems with balance and coordination.  Psych: Denies anxiety, depression, SI/HI.  No other specific complaints in a complete review of systems (except as listed in HPI above).  Objective:   Physical Exam        Assessment & Plan:

## 2019-01-23 ENCOUNTER — Telehealth: Payer: Self-pay | Admitting: Internal Medicine

## 2019-01-23 NOTE — Telephone Encounter (Signed)
Last filled 11/07/2018... please advise

## 2019-01-24 NOTE — Telephone Encounter (Signed)
[facility] Night - Client Nonclinical Telephone Record AccessNurse Client [facility] Night - Client Client Site [facility] Physician Webb Silversmith - NP Contact Type Call Who Is Calling Patient / Member / Family / Caregiver Caller Name Rayn Amacker Phone Number [phone] Patient Name Patrick Robertson Patient DOB [birth year] Call Type Message Only Information Provided Reason for Call Request for General Office Information Initial Comment Caller would like to get prescription filled for her husband. Medication: Testosterone Additional Comment Provided office hours. Caller states this has been requested twice last week. Call Closed By: Gloris Ham Transaction Date/Time: 01/23/2019 5:09:05 PM (ET)

## 2019-01-25 NOTE — Telephone Encounter (Signed)
The medications was refilled 01/23/2019

## 2019-01-28 ENCOUNTER — Other Ambulatory Visit: Payer: Self-pay | Admitting: Internal Medicine

## 2019-01-28 NOTE — Telephone Encounter (Signed)
Last filled 12/14/2018, has upcoming appt 02/2019... please advise

## 2019-02-13 ENCOUNTER — Ambulatory Visit (INDEPENDENT_AMBULATORY_CARE_PROVIDER_SITE_OTHER): Payer: BC Managed Care – PPO | Admitting: Internal Medicine

## 2019-02-13 ENCOUNTER — Other Ambulatory Visit: Payer: Self-pay

## 2019-02-13 ENCOUNTER — Encounter: Payer: Self-pay | Admitting: Internal Medicine

## 2019-02-13 VITALS — BP 136/84 | HR 74 | Temp 98.9°F | Ht 67.25 in | Wt 178.0 lb

## 2019-02-13 DIAGNOSIS — F5102 Adjustment insomnia: Secondary | ICD-10-CM | POA: Diagnosis not present

## 2019-02-13 DIAGNOSIS — Z0000 Encounter for general adult medical examination without abnormal findings: Secondary | ICD-10-CM

## 2019-02-13 DIAGNOSIS — N469 Male infertility, unspecified: Secondary | ICD-10-CM

## 2019-02-13 DIAGNOSIS — I10 Essential (primary) hypertension: Secondary | ICD-10-CM

## 2019-02-13 NOTE — Assessment & Plan Note (Signed)
Continue Ambien prn

## 2019-02-13 NOTE — Assessment & Plan Note (Signed)
Will check testosterone andPSA Continue testosterone injections

## 2019-02-13 NOTE — Patient Instructions (Signed)

## 2019-02-13 NOTE — Assessment & Plan Note (Signed)
Borderline control on Lisinopril HCT but he does not want to adjust medication at this time CMET today Reinforced DASH diet and exercise for weight loss

## 2019-02-13 NOTE — Progress Notes (Signed)
Subjective:    Patient ID: Patrick Robertson, male    DOB: May 20, 1975, 43 y.o.   MRN: [id number]  HPI  Pt presents to the clinic today for his annual exam. He is also due to follow up chronic conditions.  HTN: His BP today is 136/84. He is taking Lisinopril HCT as prescribed. He is also exercising. There is no ECG on file.  Insomnia: Only occurs on the nights he works. He takes Ambien on those nights with good results.  Male Infertility: He is managed on Testosterone injections.   Flu: 01/2019 Tetanus: 04/2010 Vision Screening: Annually Dentist: Biannually  Diet: Patient eats meats. He also eats veggies and vegetables daily. He tries to avoid fried foods. He drinks mostly water.  Exercise: He does cardio 2-3 days of cardio and another 3 days of core msucle  Review of Systems      Past Medical History:  Diagnosis Date  . Basal cell carcinoma (BCC) of chin   . Hypertension     Current Outpatient Medications  Medication Sig Dispense Refill  . lisinopril-hydrochlorothiazide (ZESTORETIC) 20-12.5 MG tablet Take 1 tablet by mouth daily. 90 tablet 0  . Multiple Vitamin (MULTIVITAMIN) tablet Take 1 tablet by mouth daily.    Marland Kitchen testosterone enanthate (DELATESTRYL) 200 MG/ML injection INJECT 0.5 ML INTO THE MUSCLE ONCE A WEEK 5 mL 0  . zolpidem (AMBIEN) 5 MG tablet TAKE 1 TABLET BY MOUTH EVERYDAY AT BEDTIME 30 tablet 0   No current facility-administered medications for this visit.     No Known Allergies  Family History  Problem Relation Age of Onset  . Hypertension Mother   . Hypertension Father   . Hypertension Maternal Grandmother   . Hypertension Maternal Grandfather   . Cancer Maternal Grandfather   . Hypertension Paternal Grandmother   . Hypertension Paternal Grandfather   . Cancer Paternal Grandfather   . Hypertension Brother     Social History   Socioeconomic History  . Marital status: Married    Spouse name: Not on file  . Number of children: Not on file   . Years of education: Not on file  . Highest education level: Not on file  Occupational History  . Not on file  Social Needs  . Financial resource strain: Not on file  . Food insecurity    Worry: Not on file    Inability: Not on file  . Transportation needs    Medical: Not on file    Non-medical: Not on file  Tobacco Use  . Smoking status: Never Smoker  . Smokeless tobacco: Never Used  Substance and Sexual Activity  . Alcohol use: No    Alcohol/week: 1.0 standard drinks    Types: 1 Cans of beer per week  . Drug use: No  . Sexual activity: Yes  Lifestyle  . Physical activity    Days per week: Not on file    Minutes per session: Not on file  . Stress: Not on file  Relationships  . Social Herbalist on phone: Not on file    Gets together: Not on file    Attends religious service: Not on file    Active member of club or organization: Not on file    Attends meetings of clubs or organizations: Not on file    Relationship status: Not on file  . Intimate partner violence    Fear of current or ex partner: Not on file    Emotionally abused: Not on file  Physically abused: Not on file    Forced sexual activity: Not on file  Other Topics Concern  . Not on file  Social History Narrative  . Not on file     Constitutional: Denies fever, malaise, fatigue, headache or abrupt weight changes.  HEENT: Denies eye pain, eye redness, ear pain, ringing in the ears, wax buildup, runny nose, nasal congestion, bloody nose, or sore throat. Respiratory: Denies difficulty breathing, shortness of breath, cough or sputum production.   Cardiovascular: Denies chest pain, chest tightness, palpitations or swelling in the hands or feet.  Gastrointestinal: Denies abdominal pain, bloating, constipation, diarrhea or blood in the stool.  GU: Denies urgency, frequency, pain with urination, burning sensation, blood in urine, odor or discharge. Musculoskeletal: Denies decrease in range of  motion, difficulty with gait, muscle pain or joint pain and swelling.  Skin: Denies redness, rashes, lesions or ulcercations.  Neurological: Denies dizziness, difficulty with memory, difficulty with speech or problems with balance and coordination.  Psych: Denies anxiety, depression, SI/HI.  No other specific complaints in a complete review of systems (except as listed in HPI above).  Objective:   Physical Exam   There were no vitals taken for this visit. Wt Readings from Last 3 Encounters:  12/22/17 183 lb (83 kg)  09/24/17 184 lb 14.4 oz (83.9 kg)  11/25/16 184 lb (83.5 kg)    General: Appears their stated age, well developed, well nourished in NAD. Skin: Warm, dry and intact. No rashes, lesions or ulcerations noted. HEENT: Head: normal shape and size; Eyes: sclera white, no icterus, conjunctiva pink, PERRLA and EOMs intact; Ears: Tm's gray and intact, normal light reflex; Nose: mucosa pink and moist, septum midline; Throat/Mouth: Teeth present, mucosa pink and moist, no exudate, lesions or ulcerations noted.  Neck:  Neck supple, trachea midline. No masses, lumps or thyromegaly present.  Cardiovascular: Normal rate and rhythm. S1,S2 noted.  No murmur, rubs or gallops noted. No JVD or BLE edema. No carotid bruits noted. Pulmonary/Chest: Normal effort and positive vesicular breath sounds. No respiratory distress. No wheezes, rales or ronchi noted.  Abdomen: Soft and nontender. Normal bowel sounds. No distention or masses noted. Liver, spleen and kidneys non palpable. Musculoskeletal: Normal range of motion. No signs of joint swelling. No difficulty with gait.  Neurological: Alert and oriented. Cranial nerves II-XII grossly intact. Coordination normal.  Psychiatric: Mood and affect normal. Behavior is normal. Judgment and thought content normal.   EKG:  BMET    Component Value Date/Time   NA 137 12/22/2017 1456   K 3.8 12/22/2017 1456   CL 98 12/22/2017 1456   CO2 30 12/22/2017  1456   GLUCOSE 79 12/22/2017 1456   BUN 16 12/22/2017 1456   CREATININE 1.06 12/22/2017 1456   CALCIUM 9.6 12/22/2017 1456    Lipid Panel     Component Value Date/Time   CHOL 117 09/14/2018 1004   TRIG 216.0 (H) 09/14/2018 1004   HDL 20.20 (L) 09/14/2018 1004   CHOLHDL 6 09/14/2018 1004   VLDL 43.2 (H) 09/14/2018 1004   LDLCALC 69 10/04/2015 1505    CBC    Component Value Date/Time   WBC 8.9 12/22/2017 1456   RBC 5.34 12/22/2017 1456   HGB 16.1 12/22/2017 1456   HCT 45.7 12/22/2017 1456   PLT 266.0 12/22/2017 1456   MCV 85.5 12/22/2017 1456   MCHC 35.2 12/22/2017 1456   RDW 13.1 12/22/2017 1456   LYMPHSABS 3.0 12/10/2016 1434   MONOABS 0.5 12/10/2016 1434   EOSABS  0.1 12/10/2016 1434   BASOSABS 0.1 12/10/2016 1434    Hgb A1C No results found for: HGBA1C         Assessment & Plan:

## 2019-02-17 ENCOUNTER — Other Ambulatory Visit (INDEPENDENT_AMBULATORY_CARE_PROVIDER_SITE_OTHER): Payer: BC Managed Care – PPO

## 2019-02-17 DIAGNOSIS — N469 Male infertility, unspecified: Secondary | ICD-10-CM | POA: Diagnosis not present

## 2019-02-17 DIAGNOSIS — Z0000 Encounter for general adult medical examination without abnormal findings: Secondary | ICD-10-CM

## 2019-02-17 DIAGNOSIS — I10 Essential (primary) hypertension: Secondary | ICD-10-CM | POA: Diagnosis not present

## 2019-02-17 LAB — COMPREHENSIVE METABOLIC PANEL
ALT: 28 U/L (ref 0–53)
AST: 21 U/L (ref 0–37)
Albumin: 4.4 g/dL (ref 3.5–5.2)
Alkaline Phosphatase: 68 U/L (ref 39–117)
BUN: 16 mg/dL (ref 6–23)
CO2: 33 mEq/L — ABNORMAL HIGH (ref 19–32)
Calcium: 9.2 mg/dL (ref 8.4–10.5)
Chloride: 103 mEq/L (ref 96–112)
Creatinine, Ser: 1.05 mg/dL (ref 0.40–1.50)
GFR: 76.53 mL/min (ref >60.00)
Glucose, Bld: 100 mg/dL — ABNORMAL HIGH (ref 70–99)
Potassium: 4.1 mEq/L (ref 3.5–5.1)
Sodium: 142 mEq/L (ref 135–145)
Total Bilirubin: 0.9 mg/dL (ref 0.2–1.2)
Total Protein: 6.8 g/dL (ref 6.0–8.3)

## 2019-02-17 LAB — CBC
HCT: 44.5 % (ref 39.0–52.0)
Hemoglobin: 15.9 g/dL (ref 13.0–17.0)
MCHC: 35.6 g/dL (ref 30.0–36.0)
MCV: 87.3 fl (ref 78.0–100.0)
Platelets: 193 10*3/uL (ref 150.0–400.0)
RBC: 5.09 Mil/uL (ref 4.22–5.81)
RDW: 12.9 % (ref 11.5–15.5)
WBC: 6.1 10*3/uL (ref 4.0–10.5)

## 2019-02-17 LAB — LIPID PANEL
Cholesterol: 126 mg/dL (ref 0–200)
HDL: 21.5 mg/dL — ABNORMAL LOW (ref >39.00)
NonHDL: 104.94
Total CHOL/HDL Ratio: 6
Triglycerides: 259 mg/dL — ABNORMAL HIGH (ref 0.0–149.0)
VLDL: 51.8 mg/dL — ABNORMAL HIGH (ref 0.0–40.0)

## 2019-02-17 LAB — LDL CHOLESTEROL, DIRECT: Direct LDL: 78 mg/dL

## 2019-02-17 LAB — TESTOSTERONE: Testosterone: 406.56 ng/dL (ref 300.00–890.00)

## 2019-02-17 LAB — PSA: PSA: 0.69 ng/mL (ref 0.10–4.00)

## 2019-02-23 ENCOUNTER — Telehealth: Payer: Self-pay | Admitting: Internal Medicine

## 2019-02-23 NOTE — Telephone Encounter (Signed)
E-mail sent to charge correction requesting cancellation of the no show fee.

## 2019-02-23 NOTE — Telephone Encounter (Signed)
Hi Patrick Robertson,  Are you okay with waiving the no show fee?

## 2019-02-23 NOTE — Telephone Encounter (Signed)
That's fine

## 2019-02-23 NOTE — Telephone Encounter (Signed)
Patient's wife called and said patient received a no show charge for d.o.s. 01/06/19.  Patient's wife said she called the morning of the appointment to cancel appointment due to patient having a fever. Can no show charge be waived?

## 2019-03-23 ENCOUNTER — Telehealth: Payer: Self-pay | Admitting: Internal Medicine

## 2019-03-23 NOTE — Telephone Encounter (Signed)
carri (spouse) called wanting labs results from  [facility]  mailed to patient

## 2019-03-24 NOTE — Telephone Encounter (Signed)
Lab letter mailed. 

## 2019-03-27 ENCOUNTER — Other Ambulatory Visit: Payer: Self-pay | Admitting: Internal Medicine

## 2019-03-29 MED ORDER — FENOFIBRATE 54 MG PO TABS: 54.0000 mg | ORAL_TABLET | Freq: Every day | ORAL | 2 refills | Status: DC

## 2019-03-29 NOTE — Telephone Encounter (Signed)
Last filled 01/23/2019... please advise

## 2019-03-29 NOTE — Telephone Encounter (Signed)
Ambien last filled 01/31/2019.Marland KitchenMarland KitchenMarland Kitchen please advise

## 2019-04-26 ENCOUNTER — Other Ambulatory Visit: Payer: Self-pay | Admitting: Internal Medicine

## 2019-04-26 NOTE — Telephone Encounter (Signed)
Last filled 03/29/2019... lab last checked 02/2019... please advise

## 2019-05-19 ENCOUNTER — Ambulatory Visit: Payer: BC Managed Care – PPO | Admitting: Internal Medicine

## 2019-06-26 ENCOUNTER — Other Ambulatory Visit: Payer: Self-pay | Admitting: Internal Medicine

## 2019-06-27 NOTE — Telephone Encounter (Signed)
Last filled 04/26/2019... last lab 02/2019.Marland KitchenMarland KitchenMarland Kitchen please advise

## 2019-08-15 ENCOUNTER — Other Ambulatory Visit: Payer: Self-pay | Admitting: Internal Medicine

## 2019-08-16 MED ORDER — TESTOSTERONE ENANTHATE 200 MG/ML IM SOLN: INTRAMUSCULAR | 0 refills | Status: DC

## 2019-08-16 NOTE — Addendum Note (Signed)
Addended by: Lurlean Nanny on: 08/16/2019 02:29 PM   Modules accepted: Orders

## 2019-08-16 NOTE — Telephone Encounter (Signed)
Last filled 06/28/2019... lab last checked 02/2019.Marland KitchenMarland KitchenMarland Kitchen please advise

## 2019-08-18 MED ORDER — TESTOSTERONE ENANTHATE 200 MG/ML IM SOLN: INTRAMUSCULAR | 0 refills | Status: DC

## 2019-08-18 NOTE — Addendum Note (Signed)
Addended by: Jearld Fenton on: 08/18/2019 12:12 PM   Modules accepted: Orders

## 2019-08-18 NOTE — Telephone Encounter (Signed)
Yes

## 2019-08-21 ENCOUNTER — Other Ambulatory Visit: Payer: Self-pay | Admitting: Internal Medicine

## 2019-08-21 NOTE — Telephone Encounter (Signed)
Patient's wife called to check status of refill She stated that the pharmacy has not received this and they are getting ready to go out of town

## 2019-08-22 NOTE — Telephone Encounter (Signed)
Pt's wife reporting pharmacy did not receive fax script. Refaxed to [phone], [place] and spoke with [name]. Fax has been received.  Contacted pt's wife, Patrick Robertson and advised. [name] appreciative and verbalized understanding.

## 2019-10-09 ENCOUNTER — Other Ambulatory Visit: Payer: Self-pay | Admitting: Internal Medicine

## 2019-10-10 NOTE — Telephone Encounter (Signed)
Last filled 08/18/2019... last testosterone lab was 02/2019... please advise if okay to refill

## 2019-11-30 DIAGNOSIS — L905 Scar conditions and fibrosis of skin: Secondary | ICD-10-CM | POA: Diagnosis not present

## 2019-11-30 DIAGNOSIS — D229 Melanocytic nevi, unspecified: Secondary | ICD-10-CM | POA: Diagnosis not present

## 2019-11-30 DIAGNOSIS — L819 Disorder of pigmentation, unspecified: Secondary | ICD-10-CM | POA: Diagnosis not present

## 2019-11-30 DIAGNOSIS — L814 Other melanin hyperpigmentation: Secondary | ICD-10-CM | POA: Diagnosis not present

## 2019-12-22 ENCOUNTER — Telehealth (INDEPENDENT_AMBULATORY_CARE_PROVIDER_SITE_OTHER): Payer: BC Managed Care – PPO | Admitting: Family Medicine

## 2019-12-22 ENCOUNTER — Encounter: Payer: Self-pay | Admitting: Family Medicine

## 2019-12-22 VITALS — BP 150/99 | HR 80 | Temp 99.2°F | Ht 68.0 in | Wt 177.0 lb

## 2019-12-22 DIAGNOSIS — K1379 Other lesions of oral mucosa: Secondary | ICD-10-CM | POA: Diagnosis not present

## 2019-12-22 DIAGNOSIS — J029 Acute pharyngitis, unspecified: Secondary | ICD-10-CM | POA: Diagnosis not present

## 2019-12-22 MED ORDER — AMOXICILLIN 500 MG PO CAPS: 1000.0000 mg | ORAL_CAPSULE | Freq: Two times a day (BID) | ORAL | 0 refills | Status: DC

## 2019-12-22 MED ORDER — PREDNISONE 20 MG PO TABS: ORAL_TABLET | ORAL | 0 refills | Status: DC

## 2019-12-22 NOTE — Assessment & Plan Note (Signed)
Viral vs bacterial  vrs allergic. Associated with mild uvula swelling... treat empirically with amox x 10 days and if swelling worsening complete prednisone course.  if not improving consider CVOID test and in person exam. If trouble swallowing or breathing.Marland Kitchen go to ER.

## 2019-12-22 NOTE — Progress Notes (Signed)
VIRTUAL VISIT Due to national recommendations of social distancing due to [place] 19, a virtual visit is felt to be most appropriate for this patient at this time.   I connected with the patient on 12/22/19 at  2:20 PM EDT by virtual telehealth platform and verified that I am speaking with the correct person using two identifiers.   I discussed the limitations, risks, security and privacy concerns of performing an evaluation and management service by  virtual telehealth platform and the availability of in person appointments. I also discussed with the patient that there may be a patient responsible charge related to this service. The patient expressed understanding and agreed to proceed.  Patient location: Home Provider Location: [place] Participants: Shakeria Robinette Diona Browner and Latanya Maudlin   Chief Complaint  Patient presents with   Sore Throat    with white patches    History of Present Illness: 44 year old male patient of Lonzo Cloud presents with new onset sore throat. ONgoing x  2 days.  Today has noted swelling in uvula and white plaque on uvula.  Minor issue swallowing when lying flat.  No white plaque on tounge   He has been trying to push fluids. HAs used tylenol or ibuprofen... helps temporarily.   Low grade temp 99.0 F, no post nasal drip, no cogetion, no cough. Some sinus pressure. No SOB. No wheezing.   No sick contacts but does work at [employer].  Uptodate witthCOVID vaccine.   Non smoker.      COVID 19 screen No recent travel or known exposure to [place]   The importance of social distancing was discussed today.   Review of Systems  Constitutional: Negative for chills and fever.  HENT: Positive for sore throat. Negative for congestion and ear pain.   Eyes: Negative for pain and redness.  Respiratory: Negative for cough and shortness of breath.   Cardiovascular: Negative for chest pain, palpitations and leg swelling.  Gastrointestinal: Negative  for abdominal pain, blood in stool, constipation, diarrhea, nausea and vomiting.  Genitourinary: Negative for dysuria.  Musculoskeletal: Negative for falls and myalgias.  Skin: Negative for rash.  Neurological: Negative for dizziness.  Psychiatric/Behavioral: Negative for depression. The patient is not nervous/anxious.       Past Medical History:  Diagnosis Date   Basal cell carcinoma (BCC) of chin    Hypertension     reports that he has never smoked. He has never used smokeless tobacco. He reports that he does not drink alcohol and does not use drugs.   Current Outpatient Medications:    lisinopril-hydrochlorothiazide (ZESTORETIC) 20-12.5 MG tablet, TAKE 1 TABLET BY MOUTH EVERY DAY, Disp: 90 tablet, Rfl: 2   Multiple Vitamin (MULTIVITAMIN) tablet, Take 1 tablet by mouth daily., Disp: , Rfl:    testosterone enanthate (DELATESTRYL) 200 MG/ML injection, INJECT 0.5 MLS INTO THE MUSCLE ONCE WEEKLY, Disp: 5 mL, Rfl: 0   WELLBUTRIN XL 150 MG 24 hr tablet, Take 150 mg by mouth every morning., Disp: , Rfl:    zolpidem (AMBIEN) 10 MG tablet, Take 10 mg by mouth at bedtime as needed., Disp: , Rfl:    Observations/Objective: Blood pressure (!) 150/99, pulse 80, temperature 99.2 F (37.3 C), temperature source Temporal, height 5\' 8"  (1.727 m), weight 177 lb (80.3 kg).  Physical Exam  Physical Exam Constitutional:      General: The patient is not in acute distress. Open oropharynx visualized.. slight uvula swelling and small white plaque on uvula. Pulmonary:  Effort: Pulmonary effort is normal. No respiratory distress.  Neurological:     Mental Status: The patient is alert and oriented to person, place, and time.  Psychiatric:        Mood and Affect: Mood normal.        Behavior: Behavior normal.   Assessment and Plan Sore throat Viral vs bacterial  vrs allergic. Associated with mild uvula swelling... treat empirically with amox x 10 days and if swelling worsening complete  prednisone course.  if not improving consider CVOID test and in person exam. If trouble swallowing or breathing.Marland Kitchen go to ER.     I discussed the assessment and treatment plan with the patient. The patient was provided an opportunity to ask questions and all were answered. The patient agreed with the plan and demonstrated an understanding of the instructions.   The patient was advised to call back or seek an in-person evaluation if the symptoms worsen or if the condition fails to improve as anticipated.     Eliezer Lofts, MD

## 2019-12-27 ENCOUNTER — Other Ambulatory Visit: Payer: Self-pay | Admitting: Internal Medicine

## 2019-12-29 NOTE — Telephone Encounter (Signed)
Last filled 10/10/2019... last CPE 02/2019... please advise

## 2020-02-06 ENCOUNTER — Other Ambulatory Visit: Payer: Self-pay | Admitting: Internal Medicine

## 2020-02-06 NOTE — Telephone Encounter (Signed)
Last filled 12/31/2019... will be due for CPE after 02/2020

## 2020-03-17 ENCOUNTER — Other Ambulatory Visit: Payer: Self-pay | Admitting: Internal Medicine

## 2020-04-16 ENCOUNTER — Other Ambulatory Visit: Payer: Self-pay | Admitting: Internal Medicine

## 2020-05-01 ENCOUNTER — Telehealth: Payer: Self-pay | Admitting: Internal Medicine

## 2020-05-03 NOTE — Telephone Encounter (Signed)
Needing to get a refill on testerone injections and  Lisinopril refill

## 2020-05-06 ENCOUNTER — Other Ambulatory Visit: Payer: Self-pay | Admitting: Internal Medicine

## 2020-05-06 MED ORDER — LISINOPRIL-HYDROCHLOROTHIAZIDE 20-12.5 MG PO TABS: 1.0000 | ORAL_TABLET | Freq: Every day | ORAL | 0 refills | Status: DC

## 2020-05-06 NOTE — Telephone Encounter (Signed)
Last filled 02/07/2020... pt has appt schedule for CPE in March... please advise

## 2020-05-06 NOTE — Telephone Encounter (Signed)
Has he not been taking regularly?

## 2020-05-08 ENCOUNTER — Other Ambulatory Visit: Payer: Self-pay | Admitting: Internal Medicine

## 2020-05-21 ENCOUNTER — Other Ambulatory Visit: Payer: Self-pay | Admitting: Internal Medicine

## 2020-05-22 ENCOUNTER — Telehealth: Payer: Self-pay | Admitting: Internal Medicine

## 2020-05-22 NOTE — Telephone Encounter (Signed)
Pt called and stated that his pharmacy sent in refill request.

## 2020-05-24 DIAGNOSIS — R7989 Other specified abnormal findings of blood chemistry: Secondary | ICD-10-CM | POA: Insufficient documentation

## 2020-05-24 NOTE — Telephone Encounter (Signed)
PA has been submitted via [email or web address] awaiting response

## 2020-05-27 NOTE — Telephone Encounter (Signed)
Wife(cari) called and stated the PA was denied due to not enough info, and the insurance stated that they are needing to know  how long and why and its a continued drug he has been using for the past 16yrs

## 2020-05-29 NOTE — Telephone Encounter (Signed)
Per pt and wife, start appeal process... I called the appeals dept for urgent requests at [phone] and was instructed per auto system to lmovm with office and patient information to start the urgent appeals process.Marland KitchenMarland Kitchen

## 2020-06-17 ENCOUNTER — Telehealth: Payer: Self-pay | Admitting: *Deleted

## 2020-06-17 MED ORDER — TESTOSTERONE CYPIONATE 200 MG/ML IM SOLN: 100.0000 mg | INTRAMUSCULAR | 0 refills | Status: DC

## 2020-06-17 NOTE — Telephone Encounter (Signed)
See below

## 2020-06-17 NOTE — Telephone Encounter (Signed)
Ok to send in Testosterone Cypionate

## 2020-06-17 NOTE — Addendum Note (Signed)
Addended by: Lurlean Nanny on: 06/17/2020 02:39 PM   Modules accepted: Orders

## 2020-06-17 NOTE — Addendum Note (Signed)
Addended by: Jearld Fenton on: 06/17/2020 02:55 PM   Modules accepted: Orders

## 2020-06-17 NOTE — Telephone Encounter (Signed)
Patient's wife called stating that they are still working on the appeal for testosterone enanthate that his insurance company has denied a PA on. [name] stated that the appeal is taking too long. [name] stated that the insurance company will pay for testosterone cypionate 200  Mg/ml. [name] stated the reason he was previously switched from the cypionate is because it was causing him to be more irritable [name] is requesting that Webb Silversmith NP send in a script in for the testosterone cypionate because he is out of the other medication and this is better than nothing. [name] stating that they are still working on the appeal with the insurance company. Pharmacy [facility].

## 2020-06-18 NOTE — Telephone Encounter (Signed)
Mel please pend for me to sign

## 2020-06-18 NOTE — Telephone Encounter (Addendum)
Patient's wife called stating that after the pharmacy got the new script yesterday it was determined that the insurance company would not cover that one either. Patient's wife stated that since insurance will not cover either she would like a script for the previous testosterone enanthate that he was on be sent to the pharmacy since they know that works. Patient's wife stated that they will use a GoodRx card and pay out of pocket for the medication. Patient's wife stated that they are still working on the appeal for this medication. Pharmacy [facility]

## 2020-06-19 MED ORDER — TESTOSTERONE ENANTHATE 200 MG/ML IJ SOLN: 100.0000 mg | INTRAMUSCULAR | 0 refills | Status: DC

## 2020-06-19 NOTE — Addendum Note (Signed)
Addended by: Lurlean Nanny on: 06/19/2020 04:06 PM   Modules accepted: Orders

## 2020-06-19 NOTE — Telephone Encounter (Signed)
Patient's wife [name] left a voicemail stating that the wrong medication was sent in yesterday. Patient's wife stated that they do not want the testosterone cypionate and wants the previous one that he was on which was testosterone enanthate.

## 2020-06-19 NOTE — Telephone Encounter (Signed)
[facility]

## 2020-06-19 NOTE — Addendum Note (Signed)
Addended by: Jearld Fenton on: 06/19/2020 01:33 PM   Modules accepted: Orders

## 2020-07-01 ENCOUNTER — Encounter: Payer: Self-pay | Admitting: Internal Medicine

## 2020-07-01 ENCOUNTER — Other Ambulatory Visit: Payer: Self-pay | Admitting: Internal Medicine

## 2020-07-01 ENCOUNTER — Ambulatory Visit (INDEPENDENT_AMBULATORY_CARE_PROVIDER_SITE_OTHER): Payer: No Typology Code available for payment source | Admitting: Internal Medicine

## 2020-07-01 ENCOUNTER — Other Ambulatory Visit: Payer: Self-pay

## 2020-07-01 VITALS — BP 122/82 | HR 79 | Temp 98.4°F | Ht 67.25 in | Wt 180.0 lb

## 2020-07-01 DIAGNOSIS — F5102 Adjustment insomnia: Secondary | ICD-10-CM

## 2020-07-01 DIAGNOSIS — Z0000 Encounter for general adult medical examination without abnormal findings: Secondary | ICD-10-CM | POA: Diagnosis not present

## 2020-07-01 DIAGNOSIS — Z23 Encounter for immunization: Secondary | ICD-10-CM | POA: Diagnosis not present

## 2020-07-01 DIAGNOSIS — F419 Anxiety disorder, unspecified: Secondary | ICD-10-CM

## 2020-07-01 DIAGNOSIS — I10 Essential (primary) hypertension: Secondary | ICD-10-CM | POA: Diagnosis not present

## 2020-07-01 DIAGNOSIS — F32A Depression, unspecified: Secondary | ICD-10-CM

## 2020-07-01 DIAGNOSIS — Z1211 Encounter for screening for malignant neoplasm of colon: Secondary | ICD-10-CM

## 2020-07-01 DIAGNOSIS — R7989 Other specified abnormal findings of blood chemistry: Secondary | ICD-10-CM

## 2020-07-01 DIAGNOSIS — Z0001 Encounter for general adult medical examination with abnormal findings: Secondary | ICD-10-CM

## 2020-07-01 NOTE — Progress Notes (Signed)
Subjective:    Patient ID: Patrick Robertson, male    DOB: 29-Aug-1975, 45 y.o.   MRN: [id number]  HPI  Pt presents to the clinic today for his annual exam. He is also due to follow up chronic conditions.   HTN: His BP today is 122/82. He is taking Lisinopril HCTZ as prescribed. There is no ECG on file.  Hypotestosteronism: Managed with Testosterone injections.  He is due for repeat testosterone level today.  Insomnia: He takes Ambien as needed with good relief of symptoms.  There is no sleep study on file.  Anxiety and Depression: Managed on Wellbutrin.  He is not currently seeing a therapist.  He denies SI/HI.  Flu: 12/2017 Tetanus: 2012 COVID: [place] x2 Colon screening: Never Vision screening: Annually Dentist: Biannually  Diet: He does eat meat.  He consumes fruits and vegetables.  He occasionally eats fried food.  He drinks mostly water.  Review of Systems      Past Medical History:  Diagnosis Date   Basal cell carcinoma (BCC) of chin    Hypertension     Current Outpatient Medications  Medication Sig Dispense Refill   lisinopril-hydrochlorothiazide (ZESTORETIC) 20-12.5 MG tablet Take 1 tablet by mouth daily. 90 tablet 0   Multiple Vitamin (MULTIVITAMIN) tablet Take 1 tablet by mouth daily.     Testosterone Enanthate 200 MG/ML SOLN Inject 100 mg as directed once a week. 2 mL 0   WELLBUTRIN XL 150 MG 24 hr tablet Take 150 mg by mouth every morning.     zolpidem (AMBIEN) 10 MG tablet Take 10 mg by mouth at bedtime as needed.     No current facility-administered medications for this visit.    No Known Allergies  Family History  Problem Relation Age of Onset   Hypertension Mother    Hypertension Father    Hypertension Maternal Grandmother    Hypertension Maternal Grandfather    Cancer Maternal Grandfather    Hypertension Paternal Grandmother    Hypertension Paternal Grandfather    Cancer Paternal Grandfather    Hypertension Brother      Social History   Socioeconomic History   Marital status: Married    Spouse name: Not on file   Number of children: Not on file   Years of education: Not on file   Highest education level: Not on file  Occupational History   Not on file  Tobacco Use   Smoking status: Never Smoker   Smokeless tobacco: Never Used  Vaping Use   Vaping Use: Never used  Substance and Sexual Activity   Alcohol use: No    Alcohol/week: 1.0 standard drink    Types: 1 Cans of beer per week   Drug use: No   Sexual activity: Yes  Other Topics Concern   Not on file  Social History Narrative   Not on file   Social Determinants of Health   Financial Resource Strain: Not on file  Food Insecurity: Not on file  Transportation Needs: Not on file  Physical Activity: Not on file  Stress: Not on file  Social Connections: Not on file  Intimate Partner Violence: Not on file     Constitutional: Denies fever, malaise, fatigue, headache or abrupt weight changes.  HEENT: Denies eye pain, eye redness, ear pain, ringing in the ears, wax buildup, runny nose, nasal congestion, bloody nose, or sore throat. Respiratory: Denies difficulty breathing, shortness of breath, cough or sputum production.   Cardiovascular: Denies chest pain, chest tightness, palpitations or swelling  in the hands or feet.  Gastrointestinal: Denies abdominal pain, bloating, constipation, diarrhea or blood in the stool.  GU: Denies urgency, frequency, pain with urination, burning sensation, blood in urine, odor or discharge. Musculoskeletal: Denies decrease in range of motion, difficulty with gait, muscle pain or joint pain and swelling.  Skin: Denies redness, rashes, lesions or ulcercations.  Neurological: Patient reports insomnia.  Denies dizziness, difficulty with memory, difficulty with speech or problems with balance and coordination.  Psych: Patient has a history of anxiety and depression.  Denies  SI/HI.  No other  specific complaints in a complete review of systems (except as listed in HPI above).  Objective:   Physical Exam   BP 122/82    Pulse 79    Temp 98.4 F (36.9 C) (Temporal)    Ht 5' 7.25" (1.708 m)    Wt 180 lb (81.6 kg)    SpO2 99%    BMI 27.98 kg/m  Wt Readings from Last 3 Encounters:  07/01/20 180 lb (81.6 kg)  12/22/19 177 lb (80.3 kg)  02/13/19 178 lb (80.7 kg)    General: Appears his stated age, well developed, well nourished in NAD. Skin: Warm, dry and intact. No rashes noted. HEENT: Head: normal shape and size; Eyes: sclera white, no icterus, conjunctiva pink, PERRLA and EOMs intact; Neck:  Neck supple, trachea midline. No masses, lumps or thyromegaly present.  Cardiovascular: Normal rate and rhythm. S1,S2 noted.  No murmur, rubs or gallops noted. No JVD or BLE edema.  Pulmonary/Chest: Normal effort and positive vesicular breath sounds. No respiratory distress. No wheezes, rales or ronchi noted.  Abdomen: Soft and nontender. Normal bowel sounds. No distention or masses noted. Liver, spleen and kidneys non palpable. Musculoskeletal: Strength 5/5 BUE/BLE.  No difficulty with gait.  Neurological: Alert and oriented. Cranial nerves II-XII grossly intact. Coordination normal.  Psychiatric: Mood and affect normal. Behavior is normal. Judgment and thought content normal.     BMET    Component Value Date/Time   NA 142 02/17/2019 0913   K 4.1 02/17/2019 0913   CL 103 02/17/2019 0913   CO2 33 (H) 02/17/2019 0913   GLUCOSE 100 (H) 02/17/2019 0913   BUN 16 02/17/2019 0913   CREATININE 1.05 02/17/2019 0913   CALCIUM 9.2 02/17/2019 0913    Lipid Panel     Component Value Date/Time   CHOL 126 02/17/2019 0913   TRIG 259.0 (H) 02/17/2019 0913   HDL 21.50 (L) 02/17/2019 0913   CHOLHDL 6 02/17/2019 0913   VLDL 51.8 (H) 02/17/2019 0913   LDLCALC 69 10/04/2015 1505    CBC    Component Value Date/Time   WBC 6.1 02/17/2019 0913   RBC 5.09 02/17/2019 0913   HGB 15.9  02/17/2019 0913   HCT 44.5 02/17/2019 0913   PLT 193.0 02/17/2019 0913   MCV 87.3 02/17/2019 0913   MCHC 35.6 02/17/2019 0913   RDW 12.9 02/17/2019 0913   LYMPHSABS 3.0 12/10/2016 1434   MONOABS 0.5 12/10/2016 1434   EOSABS 0.1 12/10/2016 1434   BASOSABS 0.1 12/10/2016 1434    Hgb A1C No results found for: HGBA1C         Assessment & Plan:   Preventative Health Maintenance:  He declines flu shot Tetanus today Encouraged him to get his [place] booster Referral to GI for screening colonoscopy Encouraged him to consume a balanced diet and exercise regimen Advised him to see an eye doctor and dentist annually We will order future labs for CBC, c-Met, lipid,  testosterone  RTC in 1 year, sooner if needed Webb Silversmith, NP This visit occurred during the SARS-CoV-2 public health emergency.  Safety protocols were in place, including screening questions prior to the visit, additional usage of staff PPE, and extensive cleaning of exam room while observing appropriate contact time as indicated for disinfecting solutions.

## 2020-07-02 ENCOUNTER — Other Ambulatory Visit: Payer: Self-pay | Admitting: Internal Medicine

## 2020-07-02 ENCOUNTER — Encounter: Payer: Self-pay | Admitting: Internal Medicine

## 2020-07-02 DIAGNOSIS — F419 Anxiety disorder, unspecified: Secondary | ICD-10-CM | POA: Insufficient documentation

## 2020-07-02 NOTE — Assessment & Plan Note (Signed)
Managed on Wellbutrin Support offered today

## 2020-07-02 NOTE — Assessment & Plan Note (Signed)
Controlled on Lisinopril HCT Reinforced DASH diet C met today We will monitor

## 2020-07-02 NOTE — Assessment & Plan Note (Signed)
Continue Ambien as needed We will monitor

## 2020-07-02 NOTE — Assessment & Plan Note (Signed)
We will check testosterone level Continue testosterone self injection

## 2020-07-02 NOTE — Patient Instructions (Signed)

## 2020-07-09 ENCOUNTER — Other Ambulatory Visit (INDEPENDENT_AMBULATORY_CARE_PROVIDER_SITE_OTHER): Payer: No Typology Code available for payment source

## 2020-07-09 ENCOUNTER — Other Ambulatory Visit: Payer: Self-pay

## 2020-07-09 DIAGNOSIS — I10 Essential (primary) hypertension: Secondary | ICD-10-CM

## 2020-07-09 DIAGNOSIS — R7989 Other specified abnormal findings of blood chemistry: Secondary | ICD-10-CM

## 2020-07-09 DIAGNOSIS — Z0001 Encounter for general adult medical examination with abnormal findings: Secondary | ICD-10-CM | POA: Diagnosis not present

## 2020-07-09 LAB — LIPID PANEL
Cholesterol: 136 mg/dL (ref 0–200)
HDL: 21.4 mg/dL — ABNORMAL LOW (ref >39.00)
NonHDL: 114.37
Total CHOL/HDL Ratio: 6
Triglycerides: 276 mg/dL — ABNORMAL HIGH (ref 0.0–149.0)
VLDL: 55.2 mg/dL — ABNORMAL HIGH (ref 0.0–40.0)

## 2020-07-09 LAB — COMPREHENSIVE METABOLIC PANEL
ALT: 37 U/L (ref 0–53)
AST: 28 U/L (ref 0–37)
Albumin: 4.7 g/dL (ref 3.5–5.2)
Alkaline Phosphatase: 63 U/L (ref 39–117)
BUN: 21 mg/dL (ref 6–23)
CO2: 33 mEq/L — ABNORMAL HIGH (ref 19–32)
Calcium: 9.8 mg/dL (ref 8.4–10.5)
Chloride: 100 mEq/L (ref 96–112)
Creatinine, Ser: 1.23 mg/dL (ref 0.40–1.50)
GFR: 70.68 mL/min (ref >60.00)
Glucose, Bld: 94 mg/dL (ref 70–99)
Potassium: 3.9 mEq/L (ref 3.5–5.1)
Sodium: 139 mEq/L (ref 135–145)
Total Bilirubin: 1.3 mg/dL — ABNORMAL HIGH (ref 0.2–1.2)
Total Protein: 7.7 g/dL (ref 6.0–8.3)

## 2020-07-09 LAB — CBC
HCT: 48.5 % (ref 39.0–52.0)
Hemoglobin: 17.1 g/dL — ABNORMAL HIGH (ref 13.0–17.0)
MCHC: 35.3 g/dL (ref 30.0–36.0)
MCV: 85.7 fl (ref 78.0–100.0)
Platelets: 242 10*3/uL (ref 150.0–400.0)
RBC: 5.66 Mil/uL (ref 4.22–5.81)
RDW: 13.1 % (ref 11.5–15.5)
WBC: 6.6 10*3/uL (ref 4.0–10.5)

## 2020-07-09 LAB — LDL CHOLESTEROL, DIRECT: Direct LDL: 84 mg/dL

## 2020-07-09 LAB — TESTOSTERONE: Testosterone: 437.3 ng/dL (ref 300.00–890.00)

## 2020-07-12 ENCOUNTER — Encounter: Payer: Self-pay | Admitting: Gastroenterology

## 2020-07-18 ENCOUNTER — Other Ambulatory Visit (HOSPITAL_COMMUNITY): Payer: Self-pay | Admitting: Emergency Medicine

## 2020-08-05 ENCOUNTER — Other Ambulatory Visit: Payer: Self-pay | Admitting: Internal Medicine

## 2020-08-08 ENCOUNTER — Ambulatory Visit: Payer: No Typology Code available for payment source

## 2020-08-09 ENCOUNTER — Other Ambulatory Visit: Payer: Self-pay | Admitting: Internal Medicine

## 2020-08-12 ENCOUNTER — Other Ambulatory Visit: Payer: Self-pay | Admitting: Internal Medicine

## 2020-08-12 NOTE — Telephone Encounter (Signed)
Requested medication (s) are due for refill today Undetermined  Requested medication (s) are on the active medication list Yes  Future visit scheduled No.  LOV @ [facility] 07/02/19.  Note to clinic-Testosterone Enanthate is not delegated for [facility] to refill.   Requested Prescriptions  Pending Prescriptions Disp Refills   Testosterone Enanthate 200 MG/ML SOLN 2 mL 0    Sig: Inject 100 mg as directed once a week.      Off-Protocol Failed - 08/12/2020  2:10 PM      Failed - Medication not assigned to a protocol, review manually.      Failed - Valid encounter within last 12 months    Recent Outpatient Visits   None

## 2020-08-12 NOTE — Telephone Encounter (Signed)
Copied from [place] [id number]. Topic: Quick Communication - Rx Refill/Question >> Aug 12, 2020  2:01 PM Leward Quan A wrote: Medication: Testosterone Enanthate 200 MG/ML SOLN  Has the patient contacted their pharmacy? Yes.   (Agent: If no, request that the patient contact the pharmacy for the refill.) (Agent: If yes, when and what did the pharmacy advise?)  Preferred Pharmacy (with phone number or street name): [id number] [place], [place]  Phone:  [phone] Fax:  [phone]     Agent: Please be advised that RX refills may take up to 3 business days. We ask that you follow-up with your pharmacy.

## 2020-08-13 NOTE — Telephone Encounter (Signed)
Will he be following me to [place]?

## 2020-08-14 ENCOUNTER — Other Ambulatory Visit: Payer: Self-pay | Admitting: Internal Medicine

## 2020-08-14 MED ORDER — TESTOSTERONE ENANTHATE 200 MG/ML IJ SOLN
100.0000 mg | INTRAMUSCULAR | 0 refills | Status: DC
Start: 2020-08-14 — End: 2020-09-23

## 2020-08-14 NOTE — Telephone Encounter (Signed)
   Notes to clinic: Left a vm for patient to call office to schedule appt with Rollene Fare is he wants to continue care with her   Requested Prescriptions  Pending Prescriptions Disp Refills   testosterone enanthate (DELATESTRYL) 200 MG/ML injection [Pharmacy Med Name: TESTOSTERON ENAN 1,000 MG/5 ML] 5 mL 0    Sig: INJECT 100 MG AS DIRECTED ONCE A WEEK.      There is no refill protocol information for this order

## 2020-09-23 ENCOUNTER — Other Ambulatory Visit: Payer: Self-pay | Admitting: Internal Medicine

## 2020-10-31 ENCOUNTER — Other Ambulatory Visit: Payer: Self-pay | Admitting: Internal Medicine

## 2020-10-31 NOTE — Telephone Encounter (Signed)
Last filled 09-23-20 #28ml Last OV/CPE 07-01-20 No Future OV [facility] [place]

## 2020-10-31 NOTE — Telephone Encounter (Signed)
Left message on VM per DPR asking pt to let us know if he plans to transfer to [facility] [place] or if he was staying at [facility]. If staying at [facility], we ask that he make a TOC appt with Romilda Garret.

## 2020-11-26 ENCOUNTER — Other Ambulatory Visit: Payer: Self-pay

## 2020-11-26 MED ORDER — WELLBUTRIN XL 150 MG PO TB24: 150.0000 mg | ORAL_TABLET | Freq: Every morning | ORAL | 2 refills | Status: DC

## 2020-11-26 NOTE — Addendum Note (Signed)
Addended by: Wilson Singer on: 11/26/2020 01:27 PM   Modules accepted: Orders

## 2020-11-26 NOTE — Telephone Encounter (Signed)
I can manage his Wellbutrin Rx.  Does he need a refill now?

## 2020-11-26 NOTE — Telephone Encounter (Signed)
I spoke with the patient wife concerning the patient Wellbutrin prescription. She states that he no longer sees his psychiatrist and want to know if you will be willing to take over his Wellbutrin prescription. He currently takes Wellbutrin 150 MG XL once daily. He was last seen in the office March of this year.

## 2020-12-01 ENCOUNTER — Other Ambulatory Visit: Payer: Self-pay | Admitting: Family

## 2020-12-06 ENCOUNTER — Other Ambulatory Visit: Payer: Self-pay | Admitting: Internal Medicine

## 2020-12-06 MED ORDER — TESTOSTERONE ENANTHATE 200 MG/ML IM SOLN: INTRAMUSCULAR | 0 refills | Status: DC

## 2020-12-13 ENCOUNTER — Other Ambulatory Visit: Payer: Self-pay | Admitting: Internal Medicine

## 2020-12-13 NOTE — Telephone Encounter (Signed)
   Requested medications are on the active medication list yes  Last visit was at [facility]  Future visit scheduled No  Notes to clinic there are calls and refills from [facility] but no actual appt scheduled, just making sure this pt is going to be a [facility] pt. Rx has request from pharmacy pertaining to generic vs name brand. Please assess.

## 2020-12-25 ENCOUNTER — Telehealth: Payer: Self-pay

## 2020-12-25 MED ORDER — TESTOSTERONE ENANTHATE 200 MG/ML IM SOLN: INTRAMUSCULAR | 0 refills | Status: DC

## 2020-12-25 MED ORDER — ZOLPIDEM TARTRATE 10 MG PO TABS: 10.0000 mg | ORAL_TABLET | Freq: Every evening | ORAL | 0 refills | Status: DC | PRN

## 2020-12-25 NOTE — Addendum Note (Signed)
Addended by: Jearld Fenton on: 12/25/2020 03:13 PM   Modules accepted: Orders

## 2020-12-25 NOTE — Telephone Encounter (Signed)
Testosterone is back ordered at our current pharmacy. [facility] [address] but because it's a controlled substance you have to call it in again for him please. Also he would like you to prescribe the ambien '10mg'$  every night for insomnia like you were doing at are normal [facility] [place] please. If you have questions you can call him [phone]. Thanks [employer]

## 2020-12-25 NOTE — Telephone Encounter (Signed)
Rx for Ambien and testosterone sent to [place]

## 2021-02-07 ENCOUNTER — Other Ambulatory Visit: Payer: Self-pay | Admitting: Internal Medicine

## 2021-03-23 ENCOUNTER — Other Ambulatory Visit: Payer: Self-pay | Admitting: Internal Medicine

## 2021-03-24 NOTE — Telephone Encounter (Signed)
Requested medication (s) are due for refill today: Yes  Requested medication (s) are on the active medication list: Yes  Last refill:  02/12/21  Future visit scheduled: No, left message to call and make appointment.  Notes to clinic:  See request.    Requested Prescriptions  Pending Prescriptions Disp Refills   zolpidem (AMBIEN) 10 MG tablet [Pharmacy Med Name: ZOLPIDEM TARTRATE 10 MG TABLET] 30 tablet 0    Sig: TAKE 1 TABLET BY MOUTH EVERY DAY AT BEDTIME AS NEEDED     Not Delegated - Psychiatry:  Anxiolytics/Hypnotics Failed - 03/23/2021  9:35 PM      Failed - This refill cannot be delegated      Failed - Urine Drug Screen completed in last 360 days      Failed - Valid encounter within last 6 months    Recent Outpatient Visits   None

## 2021-03-24 NOTE — Telephone Encounter (Signed)
Requested medication (s) are due for refill today: Yes  Requested medication (s) are on the active medication list: Yes  Last refill:  fenofibrate 08/12/20  testosterone  12/25/20  Future visit scheduled: No, left message to call and make appointment.  Notes to clinic:  See requests.    Requested Prescriptions  Pending Prescriptions Disp Refills   testosterone enanthate (DELATESTRYL) 200 MG/ML injection [Pharmacy Med Name: TESTOSTERON ENAN 1,000 MG/5 ML] 5 mL 0    Sig: INJECT 0.5 ML (100 MG TOTAL) AS DIRECTED ONCE A WEEK. DISCARD VIAL 28 DAYS AFTER PUNCTURE.     Off-Protocol Failed - 03/23/2021  9:37 PM      Failed - Medication not assigned to a protocol, review manually.      Failed - Valid encounter within last 12 months    Recent Outpatient Visits   None             fenofibrate 54 MG tablet [Pharmacy Med Name: FENOFIBRATE 54 MG TABLET] 90 tablet 2    Sig: TAKE 1 TABLET BY MOUTH EVERY DAY     Cardiovascular:  Antilipid - Fibric Acid Derivatives Failed - 03/23/2021  9:37 PM      Failed - HDL in normal range and within 360 days    HDL  Date Value Ref Range Status  07/09/2020 21.40 (L) >39.00 mg/dL Final          Failed - Triglycerides in normal range and within 360 days    Triglycerides  Date Value Ref Range Status  07/09/2020 276.0 (H) 0.0 - 149.0 mg/dL Final    Comment:    Normal:  <150 mg/dLBorderline High:  150 - 199 mg/dL          Failed - ALT in normal range and within 180 days    ALT  Date Value Ref Range Status  07/09/2020 37 0 - 53 U/L Final          Failed - AST in normal range and within 180 days    AST  Date Value Ref Range Status  07/09/2020 28 0 - 37 U/L Final          Failed - Cr in normal range and within 180 days    Creatinine, Ser  Date Value Ref Range Status  07/09/2020 1.23 0.40 - 1.50 mg/dL Final          Failed - eGFR in normal range and within 180 days    GFR  Date Value Ref Range Status  07/09/2020 70.68 >60.00 mL/min  Final    Comment:    Calculated using the CKD-EPI Creatinine Equation (2021)          Failed - Valid encounter within last 12 months    Recent Outpatient Visits   None            Passed - Total Cholesterol in normal range and within 360 days    Cholesterol  Date Value Ref Range Status  07/09/2020 136 0 - 200 mg/dL Final    Comment:    ATP III Classification       Desirable:  < 200 mg/dL               Borderline High:  200 - 239 mg/dL          High:  > = 240 mg/dL          Passed - LDL in normal range and within 360 days    LDL Cholesterol  Date Value  Ref Range Status  10/04/2015 69 0 - 99 mg/dL Final   Direct LDL  Date Value Ref Range Status  07/09/2020 84.0 mg/dL Final    Comment:    Optimal:  <100 mg/dLNear or Above Optimal:  100-129 mg/dLBorderline High:  130-159 mg/dLHigh:  160-189 mg/dLVery High:  >190 mg/dL

## 2021-05-19 ENCOUNTER — Other Ambulatory Visit: Payer: Self-pay | Admitting: Internal Medicine

## 2021-05-19 NOTE — Telephone Encounter (Signed)
Requested medications are due for refill today.  yes  Requested medications are on the active medications list.  yes  Last refill. 03/24/2021  Future visit scheduled.   yes  Notes to clinic.  Medication not delegated.    Requested Prescriptions  Pending Prescriptions Disp Refills   zolpidem (AMBIEN) 10 MG tablet [Pharmacy Med Name: ZOLPIDEM TARTRATE 10 MG TABLET] 30 tablet 0    Sig: TAKE 1 TABLET BY MOUTH EVERY DAY AT BEDTIME AS NEEDED     Not Delegated - Psychiatry:  Anxiolytics/Hypnotics Failed - 05/19/2021  6:22 AM      Failed - This refill cannot be delegated      Failed - Urine Drug Screen completed in last 360 days      Failed - Valid encounter within last 6 months    Recent Outpatient Visits   None     Future Appointments             In 1 month [place], Coralie Keens, NP [facility], [facility]

## 2021-05-20 NOTE — Telephone Encounter (Signed)
Requested medication (s) are due for refill today:   Yes, testosterone provider to review  Requested medication (s) are on the active medication list:   Yes both  Future visit scheduled:   Yes 07/04/2021 with Rollene Fare   Last ordered: Zestoretic 07/03/2020 #90, 3 refills;  testosterone 03/24/2021 5 ml, 0 refills  Returned because protocol criteria not met.       Requested Prescriptions  Pending Prescriptions Disp Refills   lisinopril-hydrochlorothiazide (ZESTORETIC) 20-12.5 MG tablet [Pharmacy Med Name: LISINOPRIL-HCTZ 20-12.5 MG TAB] 90 tablet 3    Sig: TAKE 1 TABLET BY MOUTH EVERY DAY     Cardiovascular:  ACEI + Diuretic Combos Failed - 05/20/2021  6:26 AM      Failed - Na in normal range and within 180 days    Sodium  Date Value Ref Range Status  07/09/2020 139 135 - 145 mEq/L Final          Failed - K in normal range and within 180 days    Potassium  Date Value Ref Range Status  07/09/2020 3.9 3.5 - 5.1 mEq/L Final          Failed - Cr in normal range and within 180 days    Creatinine, Ser  Date Value Ref Range Status  07/09/2020 1.23 0.40 - 1.50 mg/dL Final          Failed - eGFR is 30 or above and within 180 days    GFR  Date Value Ref Range Status  07/09/2020 70.68 >60.00 mL/min Final    Comment:    Calculated using the CKD-EPI Creatinine Equation (2021)          Failed - Valid encounter within last 6 months    Recent Outpatient Visits   None     Future Appointments             In 1 month [place], Coralie Keens, NP [facility], [place] - Patient is not pregnant      Passed - Last BP in normal range    BP Readings from Last 1 Encounters:  07/01/20 122/82           testosterone enanthate (DELATESTRYL) 200 MG/ML injection [Pharmacy Med Name: TESTOSTERON ENAN 1,000 MG/5 ML] 5 mL 0    Sig: INJECT 0.5 ML (100 MG TOTAL) AS DIRECTED ONCE A WEEK. DISCARD VIAL 28 DAYS AFTER PUNCTURE.     Off-Protocol Failed - 05/20/2021  6:26 AM       Failed - Medication not assigned to a protocol, review manually.      Failed - Valid encounter within last 12 months    Recent Outpatient Visits   None     Future Appointments             In 1 month [place], Coralie Keens, NP [facility], [facility]

## 2021-06-26 ENCOUNTER — Other Ambulatory Visit: Payer: Self-pay | Admitting: Internal Medicine

## 2021-06-26 NOTE — Telephone Encounter (Signed)
Requested medication (s) are due for refill today: yes ? ?Requested medication (s) are on the active medication list: yes ? ?Last refill:  05/20/21 #30/0 ? ?Future visit scheduled: yes ? ?Notes to clinic:  Unable to refill per protocol, cannot delegate. ? ? ? ?  ?Requested Prescriptions  ?Pending Prescriptions Disp Refills  ? zolpidem (AMBIEN) 10 MG tablet [Pharmacy Med Name: ZOLPIDEM TARTRATE 10 MG TABLET] 30 tablet 0  ?  Sig: TAKE 1 TABLET BY MOUTH EVERY DAY AT BEDTIME AS NEEDED  ?  ? Not Delegated - Psychiatry:  Anxiolytics/Hypnotics Failed - 06/26/2021 11:58 AM  ?  ?  Failed - This refill cannot be delegated  ?  ?  Failed - Urine Drug Screen completed in last 360 days  ?  ?  Failed - Valid encounter within last 6 months  ?  Recent Outpatient Visits   ?None ?  ?  ?Future Appointments   ? ?        ? In 1 week [place], Coralie Keens, NP [facility], [place]  ? ?  ? ?  ?  ?  ? ?

## 2021-06-26 NOTE — Telephone Encounter (Signed)
Requested medication (s) are due for refill today: yes ? ?Requested medication (s) are on the active medication list: yes ? ?Last refill:  05/20/21 70m/0 ? ?Future visit scheduled: yes ? ?Notes to clinic:  Unable to refill per protocol, medication not assigned to the refill protocol. ? ? ? ?  ?Requested Prescriptions  ?Pending Prescriptions Disp Refills  ? testosterone enanthate (DELATESTRYL) 200 MG/ML injection [Pharmacy Med Name: TESTOSTERON ENAN 1,000 MG/5 ML] 5 mL 0  ?  Sig: INJECT 0.5 ML (100 MG TOTAL) AS DIRECTED ONCE A WEEK. DISCARD VIAL 28 DAYS AFTER PUNCTURE.  ?  ? Off-Protocol Failed - 06/26/2021 12:01 PM  ?  ?  Failed - Medication not assigned to a protocol, review manually.  ?  ?  Failed - Valid encounter within last 12 months  ?  Recent Outpatient Visits   ?None ?  ?  ?Future Appointments   ? ?        ? In 1 week [place] [place] ? ?  ? ?  ?  ?  ? ?

## 2021-07-04 ENCOUNTER — Encounter: Payer: Self-pay | Admitting: Internal Medicine

## 2021-08-04 ENCOUNTER — Encounter: Payer: Self-pay | Admitting: Internal Medicine

## 2021-08-04 ENCOUNTER — Ambulatory Visit (INDEPENDENT_AMBULATORY_CARE_PROVIDER_SITE_OTHER): Payer: No Typology Code available for payment source | Admitting: Internal Medicine

## 2021-08-04 VITALS — BP 152/98 | HR 67 | Temp 97.8°F | Resp 18 | Ht 67.5 in | Wt 188.6 lb

## 2021-08-04 DIAGNOSIS — R7989 Other specified abnormal findings of blood chemistry: Secondary | ICD-10-CM | POA: Diagnosis not present

## 2021-08-04 DIAGNOSIS — Z6829 Body mass index (BMI) 29.0-29.9, adult: Secondary | ICD-10-CM

## 2021-08-04 DIAGNOSIS — E663 Overweight: Secondary | ICD-10-CM | POA: Insufficient documentation

## 2021-08-04 DIAGNOSIS — F32A Depression, unspecified: Secondary | ICD-10-CM

## 2021-08-04 DIAGNOSIS — N469 Male infertility, unspecified: Secondary | ICD-10-CM | POA: Diagnosis not present

## 2021-08-04 DIAGNOSIS — Z0001 Encounter for general adult medical examination with abnormal findings: Secondary | ICD-10-CM | POA: Diagnosis not present

## 2021-08-04 DIAGNOSIS — F419 Anxiety disorder, unspecified: Secondary | ICD-10-CM

## 2021-08-04 DIAGNOSIS — F5102 Adjustment insomnia: Secondary | ICD-10-CM

## 2021-08-04 DIAGNOSIS — Z6828 Body mass index (BMI) 28.0-28.9, adult: Secondary | ICD-10-CM | POA: Insufficient documentation

## 2021-08-04 DIAGNOSIS — I10 Essential (primary) hypertension: Secondary | ICD-10-CM | POA: Diagnosis not present

## 2021-08-04 NOTE — Assessment & Plan Note (Signed)
Elevated today ?He will monitor it at home x 1 week and update me with readings ?Reinforced DASH diet and exercise for weight loss ?CMET today ?

## 2021-08-04 NOTE — Assessment & Plan Note (Signed)
Controlled on current dose of Bupropion ?Support offered ?

## 2021-08-04 NOTE — Assessment & Plan Note (Signed)
Encouraged diet and exercise for weight loss ?

## 2021-08-04 NOTE — Assessment & Plan Note (Signed)
Continue Ambien. 

## 2021-08-04 NOTE — Assessment & Plan Note (Signed)
Testosterone level today ?Continue current Testosterone dose at this time ?

## 2021-08-04 NOTE — Progress Notes (Signed)
? ?Subjective:  ? ? Patient ID: Patrick Robertson, male    DOB: December 31, 1975, 46 y.o.   MRN: [id number] ? ?HPI ? ?Patient presents to clinic today for his annual exam.  He is also due to follow-up chronic conditions. ? ?HTN: His BP today is 168/98.  He is taking Lisinopril HCTZ as prescribed.  There is no ECG on file. ? ?Hypotestosteronism: Managed with Testosterone injections.  He does not follow with urology. ? ?Insomnia: He has difficulty falling asleep.  He takes Ambien as needed with good relief of symptoms.  There is no sleep study on file. ? ?Anxiety and Depression: Managed on Bupropion.  He is not currently seeing a therapist.  He denies SI/HI. ? ?HLD: His last LDL was 69, triglycerides 276, 06/2020.  He is taking Fenofibrate as prescribed.  He does not consume a low-fat diet. ? ?Flu: 12/2020 ?Tetanus: 06/2020 ?COVID: Pfizer x3 ?Colon screening: never ?Vision screening: annually ?Dentist: biannually ? ?Diet: He does eat meat. He consumes fruits and veggies. He tries to avoid fried foods. He drinks mostly water and coffee. ?Exercise: Spin class and walking ? ? ?Review of Systems ? ?   ?Past Medical History:  ?Diagnosis Date  ? Basal cell carcinoma (BCC) of chin   ? Hypertension   ? ? ?Current Outpatient Medications  ?Medication Sig Dispense Refill  ? fenofibrate 54 MG tablet TAKE 1 TABLET BY MOUTH EVERY DAY 90 tablet 2  ? lisinopril-hydrochlorothiazide (ZESTORETIC) 20-12.5 MG tablet TAKE 1 TABLET BY MOUTH EVERY DAY 90 tablet 0  ? Multiple Vitamin (MULTIVITAMIN) tablet Take 1 tablet by mouth daily.    ? testosterone enanthate (DELATESTRYL) 200 MG/ML injection INJECT 0.5 ML (100 MG TOTAL) AS DIRECTED ONCE A WEEK. DISCARD VIAL 28 DAYS AFTER PUNCTURE. 5 mL 0  ? WELLBUTRIN XL 150 MG 24 hr tablet TAKE 1 TABLET BY MOUTH EVERY DAY IN THE MORNING 90 tablet 2  ? zolpidem (AMBIEN) 10 MG tablet TAKE 1 TABLET BY MOUTH EVERY DAY AT BEDTIME AS NEEDED 30 tablet 0  ? ?No current facility-administered medications for this visit.   ? ? ?No Known Allergies ? ?Family History  ?Problem Relation Age of Onset  ? Hypertension Mother   ? Hypertension Father   ? Hypertension Maternal Grandmother   ? Hypertension Maternal Grandfather   ? Cancer Maternal Grandfather   ? Hypertension Paternal Grandmother   ? Hypertension Paternal Grandfather   ? Cancer Paternal Grandfather   ? Hypertension Brother   ? ? ?Social History  ? ?Socioeconomic History  ? Marital status: Married  ?  Spouse name: Not on file  ? Number of children: Not on file  ? Years of education: Not on file  ? Highest education level: Not on file  ?Occupational History  ? Not on file  ?Tobacco Use  ? Smoking status: Never  ? Smokeless tobacco: Never  ?Vaping Use  ? Vaping Use: Never used  ?Substance and Sexual Activity  ? Alcohol use: No  ?  Alcohol/week: 1.0 standard drink  ?  Types: 1 Cans of beer per week  ? Drug use: No  ? Sexual activity: Yes  ?Other Topics Concern  ? Not on file  ?Social History Narrative  ? Not on file  ? ?Social Determinants of Health  ? ?Financial Resource Strain: Not on file  ?Food Insecurity: Not on file  ?Transportation Needs: Not on file  ?Physical Activity: Not on file  ?Stress: Not on file  ?Social Connections: Not on file  ?  Intimate Partner Violence: Not on file  ? ? ? ?Constitutional: Denies fever, malaise, fatigue, headache or abrupt weight changes.  ?HEENT: Denies eye pain, eye redness, ear pain, ringing in the ears, wax buildup, runny nose, nasal congestion, bloody nose, or sore throat. ?Respiratory: Denies difficulty breathing, shortness of breath, cough or sputum production.   ?Cardiovascular: Denies chest pain, chest tightness, palpitations or swelling in the hands or feet.  ?Gastrointestinal: Denies abdominal pain, bloating, constipation, diarrhea or blood in the stool.  ?GU: Denies urgency, frequency, pain with urination, burning sensation, blood in urine, odor or discharge. ?Musculoskeletal: Denies decrease in range of motion, difficulty with gait,  muscle pain or joint pain and swelling.  ?Skin: Denies redness, rashes, lesions or ulcercations.  ?Neurological: Patient reports insomnia.  Denies dizziness, difficulty with memory, difficulty with speech or problems with balance and coordination.  ?Psych: Patient has a history of anxiety and depression.  Denies SI/HI. ? ?No other specific complaints in a complete review of systems (except as listed in HPI above). ? ?Objective:  ? Physical Exam ? ? ?BP (!) 168/98 (BP Location: Left Arm, Patient Position: Sitting, Cuff Size: Normal)   Pulse 67   Temp 97.8 ?F (36.6 ?C) (Temporal)   Resp 18   Ht 5' 7.5" (1.715 m)   Wt 188 lb 9.6 oz (85.5 kg)   SpO2 99%   BMI 29.10 kg/m?  ? ?Wt Readings from Last 3 Encounters:  ?07/01/20 180 lb (81.6 kg)  ?12/22/19 177 lb (80.3 kg)  ?02/13/19 178 lb (80.7 kg)  ? ? ?General: Appears his stated age, overweight, in NAD. ?Skin: Warm, dry and intact.  ?HEENT: Head: normal shape and size; Eyes: sclera white, PERRLA and EOMs intact;  ?Neck:  Neck supple, trachea midline. No masses, lumps or thyromegaly present.  ?Cardiovascular: Normal rate and rhythm. S1,S2 noted.  No murmur, rubs or gallops noted. No JVD or BLE edema. ?Pulmonary/Chest: Normal effort and positive vesicular breath sounds. No respiratory distress. No wheezes, rales or ronchi noted.  ?Abdomen: Soft and nontender. Normal bowel sounds. N ?Musculoskeletal: Strength 5/5 BUE/BLE. No difficulty with gait.  ?Neurological: Alert and oriented. Cranial nerves II-XII grossly intact. Coordination normal.  ?Psychiatric: Mood and affect normal. Behavior is normal. Judgment and thought content normal.  ? ? ? ?BMET ?   ?Component Value Date/Time  ? NA 139 07/09/2020 0913  ? K 3.9 07/09/2020 0913  ? CL 100 07/09/2020 0913  ? CO2 33 (H) 07/09/2020 0913  ? GLUCOSE 94 07/09/2020 0913  ? BUN 21 07/09/2020 0913  ? CREATININE 1.23 07/09/2020 0913  ? CALCIUM 9.8 07/09/2020 0913  ? ? ?Lipid Panel  ?   ?Component Value Date/Time  ? CHOL 136  07/09/2020 0913  ? TRIG 276.0 (H) 07/09/2020 0913  ? HDL 21.40 (L) 07/09/2020 0913  ? CHOLHDL 6 07/09/2020 0913  ? VLDL 55.2 (H) 07/09/2020 0913  ? Levy 69 10/04/2015 1505  ? ? ?CBC ?   ?Component Value Date/Time  ? WBC 6.6 07/09/2020 0913  ? RBC 5.66 07/09/2020 0913  ? HGB 17.1 (H) 07/09/2020 0913  ? HCT 48.5 07/09/2020 0913  ? PLT 242.0 07/09/2020 0913  ? MCV 85.7 07/09/2020 0913  ? MCHC 35.3 07/09/2020 0913  ? RDW 13.1 07/09/2020 0913  ? LYMPHSABS 3.0 12/10/2016 1434  ? MONOABS 0.5 12/10/2016 1434  ? EOSABS 0.1 12/10/2016 1434  ? BASOSABS 0.1 12/10/2016 1434  ? ? ?Hgb A1C ?No results found for: HGBA1C ? ? ? ? ? ?   ?  Assessment & Plan:  ? ?Preventative Health Maintenance: ? ?Encouraged him to get a flu shot in the fall ?Tetanus UTD ?Encouraged him to get his COVID booster ?He declines referral to GI for screening colonoscopy ?Encouraged him to consume a balanced diet and exercise regimen ?Advised him to see an eye doctor and dentist annually ?We will check CBC, c-Met, lipid, A1c, testosterone today ? ?RTC in 2 weeks, follow up HTN ? ?Webb Silversmith, NP ? ?

## 2021-08-04 NOTE — Patient Instructions (Signed)
Hypertension, Adult ?Hypertension is another name for high blood pressure. High blood pressure forces your heart to work harder to pump blood. This can cause problems over time. ?There are two numbers in a blood pressure reading. There is a top number (systolic) over a bottom number (diastolic). It is best to have a blood pressure that is below 120/80. ?What are the causes? ?The cause of this condition is not known. Some other conditions can lead to high blood pressure. ?What increases the risk? ?Some lifestyle factors can make you more likely to develop high blood pressure: ?Smoking. ?Not getting enough exercise or physical activity. ?Being overweight. ?Having too much fat, sugar, calories, or salt (sodium) in your diet. ?Drinking too much alcohol. ?Other risk factors include: ?Having any of these conditions: ?Heart disease. ?Diabetes. ?High cholesterol. ?Kidney disease. ?Obstructive sleep apnea. ?Having a family history of high blood pressure and high cholesterol. ?Age. The risk increases with age. ?Stress. ?What are the signs or symptoms? ?High blood pressure may not cause symptoms. Very high blood pressure (hypertensive crisis) may cause: ?Headache. ?Fast or uneven heartbeats (palpitations). ?Shortness of breath. ?Nosebleed. ?Vomiting or feeling like you may vomit (nauseous). ?Changes in how you see. ?Very bad chest pain. ?Feeling dizzy. ?Seizures. ?How is this treated? ?This condition is treated by making healthy lifestyle changes, such as: ?Eating healthy foods. ?Exercising more. ?Drinking less alcohol. ?Your doctor may prescribe medicine if lifestyle changes do not help enough and if: ?Your top number is above 130. ?Your bottom number is above 80. ?Your personal target blood pressure may vary. ?Follow these instructions at home: ?Eating and drinking ? ?If told, follow the DASH eating plan. To follow this plan: ?Fill one half of your plate at each meal with fruits and vegetables. ?Fill one fourth of your plate  at each meal with whole grains. Whole grains include whole-wheat pasta, brown rice, and whole-grain bread. ?Eat or drink low-fat dairy products, such as skim milk or low-fat yogurt. ?Fill one fourth of your plate at each meal with low-fat (lean) proteins. Low-fat proteins include fish, chicken without skin, eggs, beans, and tofu. ?Avoid fatty meat, cured and processed meat, or chicken with skin. ?Avoid pre-made or processed food. ?Limit the amount of salt in your diet to less than 1,500 mg each day. ?Do not drink alcohol if: ?Your doctor tells you not to drink. ?You are pregnant, may be pregnant, or are planning to become pregnant. ?If you drink alcohol: ?Limit how much you have to: ?0-1 drink a day for women. ?0-2 drinks a day for men. ?Know how much alcohol is in your drink. In the [place] one drink equals one 12 oz bottle of beer (355 mL), one 5 oz glass of wine (148 mL), or one 1? oz glass of hard liquor (44 mL). ?Lifestyle ? ?Work with your doctor to stay at a healthy weight or to lose weight. Ask your doctor what the best weight is for you. ?Get at least 30 minutes of exercise that causes your heart to beat faster (aerobic exercise) most days of the week. This may include walking, swimming, or biking. ?Get at least 30 minutes of exercise that strengthens your muscles (resistance exercise) at least 3 days a week. This may include lifting weights or doing Pilates. ?Do not smoke or use any products that contain nicotine or tobacco. If you need help quitting, ask your doctor. ?Check your blood pressure at home as told by your doctor. ?Keep all follow-up visits. ?Medicines ?Take over-the-counter and prescription medicines   only as told by your doctor. Follow directions carefully. ?Do not skip doses of blood pressure medicine. The medicine does not work as well if you skip doses. Skipping doses also puts you at risk for problems. ?Ask your doctor about side effects or reactions to medicines that you should watch  for. ?Contact a doctor if: ?You think you are having a reaction to the medicine you are taking. ?You have headaches that keep coming back. ?You feel dizzy. ?You have swelling in your ankles. ?You have trouble with your vision. ?Get help right away if: ?You get a very bad headache. ?You start to feel mixed up (confused). ?You feel weak or numb. ?You feel faint. ?You have very bad pain in your: ?Chest. ?Belly (abdomen). ?You vomit more than once. ?You have trouble breathing. ?These symptoms may be an emergency. Get help right away. Call 911. ?Do not wait to see if the symptoms will go away. ?Do not drive yourself to the hospital. ?Summary ?Hypertension is another name for high blood pressure. ?High blood pressure forces your heart to work harder to pump blood. ?For most people, a normal blood pressure is less than 120/80. ?Making healthy choices can help lower blood pressure. If your blood pressure does not get lower with healthy choices, you may need to take medicine. ?This information is not intended to replace advice given to you by your health care provider. Make sure you discuss any questions you have with your health care provider. ?Document Revised: 01/16/2021 Document Reviewed: 01/16/2021 ?Elsevier Patient Education ? 2023 Elsevier Inc[employer] ? ?

## 2021-08-05 LAB — LIPID PANEL
Cholesterol: 140 mg/dL (ref <200)
HDL: 24 mg/dL — ABNORMAL LOW (ref >=40)
LDL Cholesterol (Calc): 86 mg/dL (calc)
Non-HDL Cholesterol (Calc): 116 mg/dL (calc) (ref <130)
Total CHOL/HDL Ratio: 5.8 (calc) — ABNORMAL HIGH (ref <5.0)
Triglycerides: 198 mg/dL — ABNORMAL HIGH (ref <150)

## 2021-08-05 LAB — COMPLETE METABOLIC PANEL WITH GFR
AG Ratio: 1.8 (calc) (ref 1.0–2.5)
ALT: 38 U/L (ref 9–46)
AST: 32 U/L (ref 10–40)
Albumin: 4.7 g/dL (ref 3.6–5.1)
Alkaline phosphatase (APISO): 59 U/L (ref 36–130)
BUN: 15 mg/dL (ref 7–25)
CO2: 32 mmol/L (ref 20–32)
Calcium: 9.6 mg/dL (ref 8.6–10.3)
Chloride: 102 mmol/L (ref 98–110)
Creat: 1.22 mg/dL (ref 0.60–1.29)
Globulin: 2.6 g/dL (calc) (ref 1.9–3.7)
Glucose, Bld: 82 mg/dL (ref 65–99)
Potassium: 3.9 mmol/L (ref 3.5–5.3)
Sodium: 141 mmol/L (ref 135–146)
Total Bilirubin: 1 mg/dL (ref 0.2–1.2)
Total Protein: 7.3 g/dL (ref 6.1–8.1)
eGFR: 74 mL/min/{1.73_m2} (ref >=60)

## 2021-08-05 LAB — CBC
HCT: 49.3 % (ref 38.5–50.0)
Hemoglobin: 16.7 g/dL (ref 13.2–17.1)
MCH: 29.8 pg (ref 27.0–33.0)
MCHC: 33.9 g/dL (ref 32.0–36.0)
MCV: 88 fL (ref 80.0–100.0)
MPV: 9.8 fL (ref 7.5–12.5)
Platelets: 231 10*3/uL (ref 140–400)
RBC: 5.6 10*6/uL (ref 4.20–5.80)
RDW: 13.1 % (ref 11.0–15.0)
WBC: 5.4 10*3/uL (ref 3.8–10.8)

## 2021-08-05 LAB — HEMOGLOBIN A1C
Hgb A1c MFr Bld: 5.5 % of total Hgb (ref <5.7)
Mean Plasma Glucose: 111 mg/dL
eAG (mmol/L): 6.2 mmol/L

## 2021-08-05 LAB — TESTOSTERONE: Testosterone: 1006 ng/dL — ABNORMAL HIGH (ref 250–827)

## 2021-08-19 ENCOUNTER — Other Ambulatory Visit: Payer: Self-pay | Admitting: Internal Medicine

## 2021-08-20 NOTE — Telephone Encounter (Signed)
Requested medications are due for refill today.  yes ? ?Requested medications are on the active medications list.  yes ? ?Last refill. 06/26/2021 #30  0 refills ? ?Future visit scheduled.   no ? ?Notes to clinic.  Medication refill is not delegated. Please review for refill. ? ? ? ?Requested Prescriptions  ?Pending Prescriptions Disp Refills  ? zolpidem (AMBIEN) 10 MG tablet [Pharmacy Med Name: ZOLPIDEM TARTRATE 10 MG TABLET] 30 tablet 0  ?  Sig: TAKE 1 TABLET BY MOUTH EVERY DAY AT BEDTIME AS NEEDED  ?  ? Not Delegated - Psychiatry:  Anxiolytics/Hypnotics Failed - 08/19/2021 11:03 AM  ?  ?  Failed - This refill cannot be delegated  ?  ?  Failed - Urine Drug Screen completed in last 360 days  ?  ?  Passed - Valid encounter within last 6 months  ?  Recent Outpatient Visits   ? ?      ? 2 weeks ago Encounter for general adult medical examination with abnormal findings  ? [facility], Coralie Keens, NP  ? ?  ?  ? ? ?  ?  ?  ?  ?

## 2021-08-22 ENCOUNTER — Other Ambulatory Visit: Payer: Self-pay

## 2021-09-24 ENCOUNTER — Other Ambulatory Visit: Payer: Self-pay | Admitting: Internal Medicine

## 2021-09-24 NOTE — Telephone Encounter (Signed)
Medication Refill - Medication: testosterone enanthate (DELATESTRYL) 200 MG/ML injection,  zolpidem (AMBIEN) 10 MG tablet  Has the patient contacted their pharmacy? Yes.   (  Preferred Pharmacy (with phone number or street name):  [id number] Phone:  [phone] Fax:  [phone]    Has the patient been seen for an appointment in the last year OR does the patient have an upcoming appointment? Yes.    Agent: Please be advised that RX refills may take up to 3 business days. We ask that you follow-up with your pharmacy.

## 2021-09-24 NOTE — Telephone Encounter (Signed)
Requested medications are due for refill today.  Unsure  Requested medications are on the active medications list.  yes  Last refill. Testosterone 05/20/2021 5 mL, Ambien 08/21/2021 #30 0 refills  Future visit scheduled.   no  Notes to clinic.  Medication refills are not delegated.    Requested Prescriptions  Pending Prescriptions Disp Refills   testosterone enanthate (DELATESTRYL) 200 MG/ML injection 5 mL 0    Sig: For IM use only     Off-Protocol Failed - 09/24/2021  1:17 PM      Failed - Medication not assigned to a protocol, review manually.      Passed - Valid encounter within last 12 months    Recent Outpatient Visits           1 month ago Encounter for general adult medical examination with abnormal findings   [facility] [place], Coralie Keens, NP               zolpidem (AMBIEN) 10 MG tablet 30 tablet 0     Not Delegated - Psychiatry:  Anxiolytics/Hypnotics Failed - 09/24/2021  1:17 PM      Failed - This refill cannot be delegated      Failed - Urine Drug Screen completed in last 360 days      Passed - Valid encounter within last 6 months    Recent Outpatient Visits           1 month ago Encounter for general adult medical examination with abnormal findings   [facility] [place], Coralie Keens, NP

## 2021-09-25 ENCOUNTER — Other Ambulatory Visit: Payer: Self-pay | Admitting: Internal Medicine

## 2021-09-25 MED ORDER — ZOLPIDEM TARTRATE 10 MG PO TABS: ORAL_TABLET | ORAL | 0 refills | Status: DC

## 2021-09-25 MED ORDER — TESTOSTERONE ENANTHATE 200 MG/ML IM SOLN: INTRAMUSCULAR | 0 refills | Status: DC

## 2021-09-25 NOTE — Telephone Encounter (Signed)
Requested Prescriptions  Pending Prescriptions Disp Refills  . lisinopril-hydrochlorothiazide (ZESTORETIC) 20-12.5 MG tablet [Pharmacy Med Name: LISINOPRIL-HCTZ 20-12.5 MG TAB] 90 tablet 0    Sig: TAKE 1 TABLET BY MOUTH EVERY DAY     Cardiovascular:  ACEI + Diuretic Combos Failed - 09/25/2021  2:03 AM      Failed - Last BP in normal range    BP Readings from Last 1 Encounters:  08/04/21 (!) 152/98         Passed - Na in normal range and within 180 days    Sodium  Date Value Ref Range Status  08/04/2021 141 135 - 146 mmol/L Final         Passed - K in normal range and within 180 days    Potassium  Date Value Ref Range Status  08/04/2021 3.9 3.5 - 5.3 mmol/L Final         Passed - Cr in normal range and within 180 days    Creat  Date Value Ref Range Status  08/04/2021 1.22 0.60 - 1.29 mg/dL Final         Passed - eGFR is 30 or above and within 180 days    GFR  Date Value Ref Range Status  07/09/2020 70.68 >60.00 mL/min Final    Comment:    Calculated using the CKD-EPI Creatinine Equation (2021)   eGFR  Date Value Ref Range Status  08/04/2021 74 > OR = 60 mL/min/1.51m Final    Comment:    The eGFR is based on the CKD-EPI 2021 equation. To calculate  the new eGFR from a previous Creatinine or Cystatin C result, go to https://www.kidney.org/professionals/ kdoqi/gfr%5Fcalculator          Passed - Patient is not pregnant      Passed - Valid encounter within last 6 months    Recent Outpatient Visits          1 month ago En[facility] for general adult medical examination with abnormal findings   [facility]             . buPROPion (WELLBUTRIN XL) 150 MG 24 hr tablet [Pharmacy Med Name: BUPROPION HCL XL 150 MG TABLET] 90 tablet 0    Sig: TAKE 1 TABLET BY MOUTH EVERY DAY IN THE MORNING     Psychiatry: Antidepressants - bupropion Failed - 09/25/2021  2:03 AM      Failed - Last BP in normal range    BP Readings from Last 1  Encounters:  08/04/21 (!) 152/98         Passed - Cr in normal range and within 360 days    Creat  Date Value Ref Range Status  08/04/2021 1.22 0.60 - 1.29 mg/dL Final         Passed - AST in normal range and within 360 days    AST  Date Value Ref Range Status  08/04/2021 32 10 - 40 U/L Final         Passed - ALT in normal range and within 360 days    ALT  Date Value Ref Range Status  08/04/2021 38 9 - 46 U/L Final         Passed - Completed PHQ-2 or PHQ-9 in the last 360 days      Passed - Valid encounter within last 6 months    Recent Outpatient Visits          1 month ago Encounter for general adult medical  examination with abnormal findings   [facility] [place], Coralie Keens, [place]

## 2021-11-03 ENCOUNTER — Other Ambulatory Visit: Payer: Self-pay | Admitting: Internal Medicine

## 2021-11-05 NOTE — Telephone Encounter (Signed)
Requested medication (s) are due for refill today: yes  Requested medication (s) are on the active medication list:yes  Last refill:  09/25/21  Future visit scheduled: no  Notes to clinic:  Unable to refill per protocol, cannot delegate.      Requested Prescriptions  Pending Prescriptions Disp Refills   zolpidem (AMBIEN) 10 MG tablet [Pharmacy Med Name: ZOLPIDEM TARTRATE 10 MG TABLET] 30 tablet 0    Sig: TAKE 1 TABLET BY MOUTH EVERY DAY AT BEDTIME AS NEEDED     Not Delegated - Psychiatry:  Anxiolytics/Hypnotics Failed - 11/03/2021  3:11 PM      Failed - This refill cannot be delegated      Failed - Urine Drug Screen completed in last 360 days      Passed - Valid encounter within last 6 months    Recent Outpatient Visits           3 months ago Encounter for general adult medical examination with abnormal findings   [facility] [place], Regina W, NP               testosterone enanthate (DELATESTRYL) 200 MG/ML injection [Pharmacy Med Name: TESTOSTERON ENAN 1,000 MG/5 ML] 5 mL 0    Sig: FOR INTRAMUSCULAR USE ONLY     Off-Protocol Failed - 11/03/2021  3:11 PM      Failed - Medication not assigned to a protocol, review manually.      Passed - Valid encounter within last 12 months    Recent Outpatient Visits           3 months ago Encounter for general adult medical examination with abnormal findings   [facility] [place], Coralie Keens, NP

## 2021-12-30 ENCOUNTER — Other Ambulatory Visit: Payer: Self-pay | Admitting: Internal Medicine

## 2021-12-30 NOTE — Telephone Encounter (Signed)
Requested Prescriptions  Pending Prescriptions Disp Refills  . lisinopril-hydrochlorothiazide (ZESTORETIC) 20-12.5 MG tablet [Pharmacy Med Name: LISINOPRIL-HCTZ 20-12.5 MG TAB] 90 tablet 0    Sig: TAKE 1 TABLET BY MOUTH EVERY DAY     Cardiovascular:  ACEI + Diuretic Combos Failed - 12/30/2021  2:29 AM      Failed - Last BP in normal range    BP Readings from Last 1 Encounters:  08/04/21 (!) 152/98         Passed - Na in normal range and within 180 days    Sodium  Date Value Ref Range Status  08/04/2021 141 135 - 146 mmol/L Final         Passed - K in normal range and within 180 days    Potassium  Date Value Ref Range Status  08/04/2021 3.9 3.5 - 5.3 mmol/L Final         Passed - Cr in normal range and within 180 days    Creat  Date Value Ref Range Status  08/04/2021 1.22 0.60 - 1.29 mg/dL Final         Passed - eGFR is 30 or above and within 180 days    GFR  Date Value Ref Range Status  07/09/2020 70.68 >60.00 mL/min Final    Comment:    Calculated using the CKD-EPI Creatinine Equation (2021)   eGFR  Date Value Ref Range Status  08/04/2021 74 > OR = 60 mL/min/1.43m Final    Comment:    The eGFR is based on the CKD-EPI 2021 equation. To calculate  the new eGFR from a previous Creatinine or Cystatin C result, go to https://www.kidney.org/professionals/ kdoqi/gfr%5Fcalculator          Passed - Patient is not pregnant      Passed - Valid encounter within last 6 months    Recent Outpatient Visits          4 months ago Encounter for general adult medical examination with abnormal findings   [facility]             . buPROPion (WELLBUTRIN XL) 150 MG 24 hr tablet [Pharmacy Med Name: BUPROPION HCL XL 150 MG TABLET] 90 tablet 0    Sig: TAKE 1 TABLET BY MOUTH EVERY DAY IN THE MORNING     Psychiatry: Antidepressants - bupropion Failed - 12/30/2021  2:29 AM      Failed - Last BP in normal range    BP Readings from Last 1  Encounters:  08/04/21 (!) 152/98         Passed - Cr in normal range and within 360 days    Creat  Date Value Ref Range Status  08/04/2021 1.22 0.60 - 1.29 mg/dL Final         Passed - AST in normal range and within 360 days    AST  Date Value Ref Range Status  08/04/2021 32 10 - 40 U/L Final         Passed - ALT in normal range and within 360 days    ALT  Date Value Ref Range Status  08/04/2021 38 9 - 46 U/L Final         Passed - Completed PHQ-2 or PHQ-9 in the last 360 days      Passed - Valid encounter within last 6 months    Recent Outpatient Visits          4 months ago Encounter for general adult medical  examination with abnormal findings   [facility] [place], Coralie Keens, [place]

## 2022-01-19 ENCOUNTER — Other Ambulatory Visit: Payer: Self-pay | Admitting: Internal Medicine

## 2022-01-20 NOTE — Telephone Encounter (Signed)
Requested medication (s) are due for refill today - yes  Requested medication (s) are on the active medication list -yes  Future visit scheduled -no  Last refill: 11/06/21 #30   Notes to clinic: non delegated Rx  Requested Prescriptions  Pending Prescriptions Disp Refills   zolpidem (AMBIEN) 10 MG tablet [Pharmacy Med Name: ZOLPIDEM TARTRATE 10 MG TABLET] 30 tablet 0    Sig: TAKE 1 TABLET BY MOUTH EVERY DAY AT BEDTIME AS NEEDED     Not Delegated - Psychiatry:  Anxiolytics/Hypnotics Failed - 01/19/2022 10:58 AM      Failed - This refill cannot be delegated      Failed - Urine Drug Screen completed in last 360 days      Passed - Valid encounter within last 6 months    Recent Outpatient Visits           5 months ago Encounter for general adult medical examination with abnormal findings   [facility] [place], Coralie Keens, NP                 Requested Prescriptions  Pending Prescriptions Disp Refills   zolpidem (AMBIEN) 10 MG tablet [Pharmacy Med Name: ZOLPIDEM TARTRATE 10 MG TABLET] 30 tablet 0    Sig: TAKE 1 TABLET BY MOUTH EVERY DAY AT BEDTIME AS NEEDED     Not Delegated - Psychiatry:  Anxiolytics/Hypnotics Failed - 01/19/2022 10:58 AM      Failed - This refill cannot be delegated      Failed - Urine Drug Screen completed in last 360 days      Passed - Valid encounter within last 6 months    Recent Outpatient Visits           5 months ago Encounter for general adult medical examination with abnormal findings   [facility] [place], Coralie Keens, NP

## 2022-02-28 ENCOUNTER — Ambulatory Visit
Admission: EM | Admit: 2022-02-28 | Discharge: 2022-02-28 | Disposition: A | Discharge status: Home / Self Care | Payer: No Typology Code available for payment source | Attending: Emergency Medicine | Admitting: Emergency Medicine

## 2022-02-28 DIAGNOSIS — L02214 Cutaneous abscess of groin: Secondary | ICD-10-CM | POA: Diagnosis not present

## 2022-02-28 MED ORDER — SULFAMETHOXAZOLE-TRIMETHOPRIM 800-160 MG PO TABS: 1.0000 | ORAL_TABLET | Freq: Two times a day (BID) | ORAL | 0 refills | Status: AC

## 2022-02-28 NOTE — Discharge Instructions (Signed)
Take the Bactrim as directed.  Keep your wound clean and dry.  Wash it gently twice a day with soap and water.     Follow up with your primary care provider on Monday.

## 2022-02-28 NOTE — ED Triage Notes (Addendum)
Patient to Urgent Care with complaints of abscess present to groin that appeared one week ago. Reports recently returning from mission trip in [place].   Area is red/ swollen/ drainage is purulent. Was prescribed a seven day course of abx in [place], has one day left of tx with no improvement. Denies any known fevers.

## 2022-02-28 NOTE — ED Provider Notes (Signed)
Patrick Robertson    CSN: [id number] Arrival date & time: 02/28/22  3845      History   Chief Complaint Chief Complaint  Patient presents with   Abscess    HPI Patrick Robertson is a 46 y.o. male.  Patient presents with an abscess in his left groin with purulent drainage x1 week.  The abscess started, and he was prescribed Augmentin; He has been taking it for 1 week and is not improving.  No fever, chills, abdominal pain, or other symptoms.  His medical history includes hypertension.  The history is provided by the patient and medical records.    Past Medical History:  Diagnosis Date   Basal cell carcinoma (BCC) of chin    Hypertension     Patient Active Problem List   Diagnosis Date Noted   Overweight with body mass index (BMI) of 29 to 29.9 in adult 08/04/2021   Anxiety and depression 07/02/2020   Low testosterone in male 05/24/2020   HTN (hypertension) 06/27/2014   Male infertility 06/27/2014   Insomnia 04/14/2013    Past Surgical History:  Procedure Laterality Date   APPENDECTOMY  2009   DISTAL BICEPS TENDON REPAIR Left 09/27/2017   Procedure: REINSERTION OF RUPTURED DISTAL BICEPS TENDON;  Surgeon: Earnestine Leys, MD;  Location: [facility] ORS;  Service: Orthopedics;  Laterality: Left;   HERNIA REPAIR  1984   MOHS SURGERY  05/2016       Home Medications    Prior to Admission medications   Medication Sig Start Date End Date Taking? Authorizing Provider  sulfamethoxazole-trimethoprim (BACTRIM DS) 800-160 MG tablet Take 1 tablet by mouth 2 (two) times daily for 7 days. 02/28/22 03/07/22 Yes Sharion Balloon, NP  buPROPion (WELLBUTRIN XL) 150 MG 24 hr tablet TAKE 1 TABLET BY MOUTH EVERY DAY IN THE MORNING 12/30/21   Jearld Fenton, NP  fenofibrate 54 MG tablet TAKE 1 TABLET BY MOUTH EVERY DAY 03/24/21   Jearld Fenton, NP  lisinopril-hydrochlorothiazide (ZESTORETIC) 20-12.5 MG tablet TAKE 1 TABLET BY MOUTH EVERY DAY 12/30/21   Jearld Fenton, NP  Multiple  Vitamin (MULTIVITAMIN) tablet Take 1 tablet by mouth daily. Patient not taking: Reported on 08/04/2021    [provider]  testosterone enanthate (DELATESTRYL) 200 MG/ML injection FOR INTRAMUSCULAR USE ONLY 11/06/21   Jearld Fenton, NP  zolpidem (AMBIEN) 10 MG tablet TAKE 1 TABLET BY MOUTH EVERY DAY AT BEDTIME AS NEEDED 01/20/22   Jearld Fenton, NP    Family History Family History  Problem Relation Age of Onset   Hypertension Mother    Hypertension Father    Hypertension Maternal Grandmother    Hypertension Maternal Grandfather    Cancer Maternal Grandfather    Hypertension Paternal Grandmother    Hypertension Paternal Grandfather    Cancer Paternal Grandfather    Hypertension Brother     Social History Social History   Tobacco Use   Smoking status: Never   Smokeless tobacco: Never  Vaping Use   Vaping Use: Never used  Substance Use Topics   Alcohol use: No    Alcohol/week: 1.0 standard drink of alcohol    Types: 1 Cans of beer per week   Drug use: No     Allergies   Patient has no known allergies.   Review of Systems Review of Systems  Constitutional:  Negative for chills and fever.  Gastrointestinal:  Negative for abdominal pain, nausea and vomiting.  Skin:  Positive for color change and wound.  All other systems reviewed and are negative.    Physical Exam Triage Vital Signs ED Triage Vitals  Enc Vitals Group     BP 02/28/22 1009 (!) 148/70     Pulse Rate 02/28/22 1004 73     Resp 02/28/22 1004 18     Temp 02/28/22 1004 98.1 F (36.7 C)     Temp src --      SpO2 02/28/22 1004 97 %     Weight 02/28/22 1009 185 lb (83.9 kg)     Height 02/28/22 1009 '5\' 8"'$  (1.727 m)     Head Circumference --      Peak Flow --      Pain Score 02/28/22 1006 2     Pain Loc --      Pain Edu? --      Excl. in [place]? --    No data found.  Updated Vital Signs BP (!) 148/70   Pulse 73   Temp 98.1 F (36.7 C)   Resp 18   Ht '5\' 8"'$  (1.727 m)   Wt 185 lb (83.9  kg)   SpO2 97%   BMI 28.13 kg/m   Visual Acuity Right Eye Distance:   Left Eye Distance:   Bilateral Distance:    Right Eye Near:   Left Eye Near:    Bilateral Near:     Physical Exam Vitals and nursing note reviewed.  Constitutional:      General: He is not in acute distress.    Appearance: Normal appearance. He is well-developed. He is not ill-appearing.  HENT:     Mouth/Throat:     Mouth: Mucous membranes are moist.  Cardiovascular:     Rate and Rhythm: Normal rate and regular rhythm.     Heart sounds: Normal heart sounds.  Pulmonary:     Effort: Pulmonary effort is normal. No respiratory distress.     Breath sounds: Normal breath sounds.  Abdominal:     Palpations: Abdomen is soft.     Tenderness: There is no abdominal tenderness.  Musculoskeletal:        General: Normal range of motion.     Cervical back: Neck supple.  Skin:    General: Skin is warm and dry.     Findings: Lesion present.     Comments: Left groin: Approximate 2 cm area of induration with central open lesion draining moderate purulent drainage.   Neurological:     General: No focal deficit present.     Mental Status: He is alert and oriented to person, place, and time.     Gait: Gait normal.  Psychiatric:        Mood and Affect: Mood normal.        Behavior: Behavior normal.      UC Treatments / Results  Labs (all labs ordered are listed, but only abnormal results are displayed) Labs Reviewed - No data to display  EKG   Radiology No results found.  Procedures Procedures (including critical care time)  Medications Ordered in UC Medications - No data to display  Initial Impression / Assessment and Plan / UC Course  I have reviewed the triage vital signs and the nursing notes.  Pertinent labs & imaging results that were available during my care of the patient were reviewed by me and considered in my medical decision making (see chart for details).    Abscess of left groin.  No  I&D indicated as wound is already draining.  Treating with Bactrim.  Instructed patient to follow up with his PCP on Monday.  Education provided on abscess.  Discussed follow up with general surgeon if not improving or reoccurrence.  Patient agrees to plan of care.    Final Clinical Impressions(s) / UC Diagnoses   Final diagnoses:  Abscess of groin, left     Discharge Instructions      Take the Bactrim as directed.  Keep your wound clean and dry.  Wash it gently twice a day with soap and water.     Follow up with your primary care provider on Monday.        ED Prescriptions     Medication Sig Dispense Auth. Provider   sulfamethoxazole-trimethoprim (BACTRIM DS) 800-160 MG tablet Take 1 tablet by mouth 2 (two) times daily for 7 days. 14 tablet Sharion Balloon, NP      PDMP not reviewed this encounter.   Sharion Balloon, NP 02/28/22 1032

## 2022-03-22 ENCOUNTER — Other Ambulatory Visit: Payer: Self-pay | Admitting: Internal Medicine

## 2022-03-24 NOTE — Telephone Encounter (Signed)
Patients wife [name] is calling to see if her husband can get a refill for his medication:   zolpidem (AMBIEN) 10 MG tablet    And testosterone enanthate (DELATESTRYL) 200 MG/ML injection.  I did make the patient an appointment for 04/24/2022, did advise [name] that the patient was suppose to have a 2 week follow up from office visit 08/04/2021.   [name] [phone] Fax: [phone]

## 2022-03-24 NOTE — Telephone Encounter (Signed)
Requested medication (s) are due for refill today: Yes  Requested medication (s) are on the active medication list: Yes  Last refill:  Testosterone - 11/06/21  Ambien - 01/20/22  Future visit scheduled: No  Notes to clinic:  See requests.    Requested Prescriptions  Pending Prescriptions Disp Refills   testosterone enanthate (DELATESTRYL) 200 MG/ML injection [Pharmacy Med Name: TESTOSTERON ENAN 1,000 MG/5 ML] 5 mL 0    Sig: FOR INTRAMUSCULAR USE ONLY     Off-Protocol Failed - 03/22/2022  9:42 AM      Failed - Medication not assigned to a protocol, review manually.      Passed - Valid encounter within last 12 months    Recent Outpatient Visits           7 months ago Encounter for general adult medical examination with abnormal findings   [facility] [place], Coralie Keens, NP               zolpidem (AMBIEN) 10 MG tablet [Pharmacy Med Name: ZOLPIDEM TARTRATE 10 MG TABLET] 30 tablet 0    Sig: TAKE 1 TABLET BY MOUTH EVERY DAY AT BEDTIME AS NEEDED     Not Delegated - Psychiatry:  Anxiolytics/Hypnotics Failed - 03/22/2022  9:42 AM      Failed - This refill cannot be delegated      Failed - Urine Drug Screen completed in last 360 days      Failed - Valid encounter within last 6 months    Recent Outpatient Visits           7 months ago Encounter for general adult medical examination with abnormal findings   [facility] [place], Coralie Keens, NP

## 2022-03-24 NOTE — Telephone Encounter (Signed)
He is past due for an appt. If he schedules this, we can refill his [place]

## 2022-04-08 NOTE — Addendum Note (Signed)
Addended by: Durwin Nora on: 04/08/2022 03:45 PM   Modules accepted: Orders

## 2022-04-08 NOTE — Telephone Encounter (Signed)
Patients wife Patrick Robertson is calling to see if her husband can get courtesy refill for his medication. Appointment was made on 03/24/2022 for 04/24/2022.  Please advise.

## 2022-04-08 NOTE — Telephone Encounter (Cosign Needed)
Requested medication (s) are due for refill today: yes   Requested medication (s) are on the active medication list: yes   Last refill:  ambien- 01/20/22 #30 0 refills, testosterone- 11/06/21 #5 ml 0 refills   Future visit scheduled: yes in 2 weeks  Notes to clinic:   requesting courtesy refill. Noted delegated per protocol and medication testosterone not assigned to a protocol. Do you want to refill Rx?     Requested Prescriptions  Pending Prescriptions Disp Refills   zolpidem (AMBIEN) 10 MG tablet 30 tablet 0     Not Delegated - Psychiatry:  Anxiolytics/Hypnotics Failed - 04/08/2022  3:45 PM      Failed - This refill cannot be delegated      Failed - Urine Drug Screen completed in last 360 days      Failed - Valid encounter within last 6 months    Recent Outpatient Visits           8 months ago Encounter for general adult medical examination with abnormal findings   [facility], Coralie Keens, NP       Future Appointments             In 2 weeks [facility], Coralie Keens, NP [facility]             testosterone enanthate (DELATESTRYL) 200 MG/ML injection 5 mL 0    Sig: FOR INTRAMUSCULAR USE ONLY     Off-Protocol Failed - 04/08/2022  3:45 PM      Failed - Medication not assigned to a protocol, review manually.      Passed - Valid encounter within last 12 months    Recent Outpatient Visits           8 months ago Encounter for general adult medical examination with abnormal findings   [facility], Coralie Keens, NP       Future Appointments             In 2 weeks Garnette Gunner, Coralie Keens, NP [facility], [facility]            Refused Prescriptions Disp Refills   testosterone enanthate (DELATESTRYL) 200 MG/ML injection [Pharmacy Med Name: TESTOSTERON ENAN 1,000 MG/5 ML] 5 mL 0    Sig: FOR INTRAMUSCULAR USE ONLY     Off-Protocol Failed - 04/08/2022  3:45 PM      Failed - Medication not assigned to a  protocol, review manually.      Passed - Valid encounter within last 12 months    Recent Outpatient Visits           8 months ago Encounter for general adult medical examination with abnormal findings   [facility], Coralie Keens, NP       Future Appointments             In 2 weeks Garnette Gunner, Coralie Keens, NP [facility], [facility]             zolpidem (AMBIEN) 10 MG tablet [Pharmacy Med Name: ZOLPIDEM TARTRATE 10 MG TABLET] 30 tablet 0    Sig: TAKE 1 TABLET BY MOUTH EVERY DAY AT BEDTIME AS NEEDED     Not Delegated - Psychiatry:  Anxiolytics/Hypnotics Failed - 04/08/2022  3:45 PM      Failed - This refill cannot be delegated      Failed - Urine Drug Screen completed in last 360 days  Failed - Valid encounter within last 6 months    Recent Outpatient Visits           8 months ago Encounter for general adult medical examination with abnormal findings   [facility], Coralie Keens, NP       Future Appointments             In 2 weeks Garnette Gunner, Coralie Keens, NP [facility], [facility]

## 2022-04-09 MED ORDER — ZOLPIDEM TARTRATE 10 MG PO TABS: ORAL_TABLET | ORAL | 0 refills | Status: DC

## 2022-04-09 MED ORDER — TESTOSTERONE ENANTHATE 200 MG/ML IM SOLN: INTRAMUSCULAR | 0 refills | Status: DC

## 2022-04-15 ENCOUNTER — Other Ambulatory Visit: Payer: Self-pay | Admitting: Internal Medicine

## 2022-04-15 MED ORDER — TESTOSTERONE ENANTHATE 200 MG/ML IM SOLN: 100.0000 mg | INTRAMUSCULAR | 0 refills | Status: DC

## 2022-04-16 ENCOUNTER — Telehealth: Payer: Self-pay | Admitting: Internal Medicine

## 2022-04-16 NOTE — Telephone Encounter (Signed)
The pharmacy called reporting that they need a PA for the patient's testosterone enanthate (DELATESTRYL) 200 MG/ML injection

## 2022-04-17 NOTE — Telephone Encounter (Signed)
PA submitted through Cover My Meds.   Thanks,   -Mickel Baas

## 2022-04-21 ENCOUNTER — Telehealth: Payer: Self-pay

## 2022-04-21 NOTE — Telephone Encounter (Unsigned)
Copied from [place] [id number]. Topic: General - Other >> Apr 21, 2022  4:03 PM Oley Balm A wrote: Reason for CRM: Patient has an appointment for 04/30/22 at 10am and is wanting to know if will be having any labs done at this visit.  Patient would like a call back if he is having labs so that he can fast before the visit.

## 2022-04-22 NOTE — Telephone Encounter (Signed)
Left message advising pt.   Thanks,   -Shivank Pinedo  

## 2022-04-22 NOTE — Telephone Encounter (Signed)
Yes he will have labs

## 2022-04-24 ENCOUNTER — Ambulatory Visit: Payer: No Typology Code available for payment source | Admitting: Internal Medicine

## 2022-04-30 ENCOUNTER — Ambulatory Visit (INDEPENDENT_AMBULATORY_CARE_PROVIDER_SITE_OTHER): Payer: No Typology Code available for payment source | Admitting: Internal Medicine

## 2022-04-30 ENCOUNTER — Encounter: Payer: Self-pay | Admitting: Internal Medicine

## 2022-04-30 VITALS — BP 135/85 | HR 74 | Temp 96.6°F | Wt 186.0 lb

## 2022-04-30 DIAGNOSIS — R7309 Other abnormal glucose: Secondary | ICD-10-CM | POA: Diagnosis not present

## 2022-04-30 DIAGNOSIS — Z6828 Body mass index (BMI) 28.0-28.9, adult: Secondary | ICD-10-CM

## 2022-04-30 DIAGNOSIS — F5102 Adjustment insomnia: Secondary | ICD-10-CM

## 2022-04-30 DIAGNOSIS — E663 Overweight: Secondary | ICD-10-CM

## 2022-04-30 DIAGNOSIS — F419 Anxiety disorder, unspecified: Secondary | ICD-10-CM

## 2022-04-30 DIAGNOSIS — E291 Testicular hypofunction: Secondary | ICD-10-CM

## 2022-04-30 DIAGNOSIS — R739 Hyperglycemia, unspecified: Secondary | ICD-10-CM

## 2022-04-30 DIAGNOSIS — I10 Essential (primary) hypertension: Secondary | ICD-10-CM

## 2022-04-30 DIAGNOSIS — F32A Depression, unspecified: Secondary | ICD-10-CM

## 2022-04-30 DIAGNOSIS — E781 Pure hyperglyceridemia: Secondary | ICD-10-CM

## 2022-04-30 MED ORDER — TESTOSTERONE ENANTHATE 200 MG/ML IM SOLN: 100.0000 mg | INTRAMUSCULAR | 0 refills | Status: DC

## 2022-04-30 NOTE — Progress Notes (Signed)
Subjective:    Patient ID: Patrick Robertson, male    DOB: 31-Mar-1976, 47 y.o.   MRN: [id number]  HPI  Patient presents to clinic today for follow-up of chronic conditions.  HTN: His BP today is 135/85.  He is taking Lisinopril HCT as prescribed.  There is no ECG on file.  Hypotestosteronism: His insurance has denied Testosterone injections which he has been on for years.  He does not follow with urology.  Insomnia: He has difficulty falling asleep.  He takes Ambien as needed with good relief of symptoms.  There is no sleep study on file.  Anxiety and Depression: Managed on Bupropion.  He is not currently seeing a therapist.  He denies SI/HI.  HLD: His last LDL was 86, triglycerides 198, 07/2021.  He is not taking Fenofibrate as prescribed.  He does not want to take statin therapy.  He does not consume a low-fat diet.  Review of Systems     Past Medical History:  Diagnosis Date   Basal cell carcinoma (BCC) of chin    Hypertension     Current Outpatient Medications  Medication Sig Dispense Refill   buPROPion (WELLBUTRIN XL) 150 MG 24 hr tablet TAKE 1 TABLET BY MOUTH EVERY DAY IN THE MORNING 90 tablet 0   fenofibrate 54 MG tablet TAKE 1 TABLET BY MOUTH EVERY DAY 90 tablet 2   lisinopril-hydrochlorothiazide (ZESTORETIC) 20-12.5 MG tablet TAKE 1 TABLET BY MOUTH EVERY DAY 90 tablet 0   Multiple Vitamin (MULTIVITAMIN) tablet Take 1 tablet by mouth daily. (Patient not taking: Reported on 08/04/2021)     testosterone enanthate (DELATESTRYL) 200 MG/ML injection Inject 0.5 mLs (100 mg total) into the muscle once a week. FOR INTRAMUSCULAR USE ONLY 5 mL 0   zolpidem (AMBIEN) 10 MG tablet TAKE 1 TABLET BY MOUTH EVERY DAY AT BEDTIME AS NEEDED 30 tablet 0   No current facility-administered medications for this visit.    No Known Allergies  Family History  Problem Relation Age of Onset   Hypertension Mother    Hypertension Father    Hypertension Maternal Grandmother    Hypertension  Maternal Grandfather    Cancer Maternal Grandfather    Hypertension Paternal Grandmother    Hypertension Paternal Grandfather    Cancer Paternal Grandfather    Hypertension Brother     Social History   Socioeconomic History   Marital status: Married    Spouse name: Not on file   Number of children: Not on file   Years of education: Not on file   Highest education level: Not on file  Occupational History   Not on file  Tobacco Use   Smoking status: Never   Smokeless tobacco: Never  Vaping Use   Vaping Use: Never used  Substance and Sexual Activity   Alcohol use: No    Alcohol/week: 1.0 standard drink of alcohol    Types: 1 Cans of beer per week   Drug use: No   Sexual activity: Yes  Other Topics Concern   Not on file  Social History Narrative   Not on file   Social Determinants of Health   Financial Resource Strain: Not on file  Food Insecurity: Not on file  Transportation Needs: Not on file  Physical Activity: Not on file  Stress: Not on file  Social Connections: Not on file  Intimate Partner Violence: Not on file     Constitutional: Denies fever, malaise, fatigue, headache or abrupt weight changes.  HEENT: Denies eye pain, eye  redness, ear pain, ringing in the ears, wax buildup, runny nose, nasal congestion, bloody nose, or sore throat. Respiratory: Patient reports lingering cough.  Denies difficulty breathing, shortness of breath, or sputum production.   Cardiovascular: Denies chest pain, chest tightness, palpitations or swelling in the hands or feet.  Gastrointestinal: Denies abdominal pain, bloating, constipation, diarrhea or blood in the stool.  GU: Denies urgency, frequency, pain with urination, burning sensation, blood in urine, odor or discharge. Musculoskeletal: Denies decrease in range of motion, difficulty with gait, muscle pain or joint pain and swelling.  Skin: Denies redness, rashes, lesions or ulcercations.  Neurological: Patient reports insomnia.   Denies dizziness, difficulty with memory, difficulty with speech or problems with balance and coordination.  Psych: Patient has a history of anxiety and depression.  Denies SI/HI.  No other specific complaints in a complete review of systems (except as listed in HPI above).  Objective:   Physical Exam  BP 135/85 (BP Location: Right Arm, Patient Position: Sitting, Cuff Size: Normal)   Pulse 74   Temp (!) 96.6 F (35.9 C) (Temporal)   Wt 186 lb (84.4 kg)   SpO2 100%   BMI 28.28 kg/m   Wt Readings from Last 3 Encounters:  02/28/22 185 lb (83.9 kg)  08/04/21 188 lb 9.6 oz (85.5 kg)  07/01/20 180 lb (81.6 kg)    General: Appears his stated age, overweight in NAD. Skin: Warm, dry and intact.  HEENT: Head: normal shape and size; Eyes: sclera white, no icterus, conjunctiva pink, PERRLA and EOMs intact;  Cardiovascular: Normal rate and rhythm. S1,S2 noted.  No murmur, rubs or gallops noted. No JVD or BLE edema.  Pulmonary/Chest: Normal effort and positive vesicular breath sounds. No respiratory distress. No wheezes, rales or ronchi noted.  Musculoskeletal: No difficulty with gait.  Neurological: Alert and oriented. Coordination normal.  Psychiatric: Mood and affect normal. Behavior is normal. Judgment and thought content normal.     BMET    Component Value Date/Time   NA 141 08/04/2021 1105   K 3.9 08/04/2021 1105   CL 102 08/04/2021 1105   CO2 32 08/04/2021 1105   GLUCOSE 82 08/04/2021 1105   BUN 15 08/04/2021 1105   CREATININE 1.22 08/04/2021 1105   CALCIUM 9.6 08/04/2021 1105    Lipid Panel     Component Value Date/Time   CHOL 140 08/04/2021 1105   TRIG 198 (H) 08/04/2021 1105   HDL 24 (L) 08/04/2021 1105   CHOLHDL 5.8 (H) 08/04/2021 1105   VLDL 55.2 (H) 07/09/2020 0913   LDLCALC 86 08/04/2021 1105    CBC    Component Value Date/Time   WBC 5.4 08/04/2021 1105   RBC 5.60 08/04/2021 1105   HGB 16.7 08/04/2021 1105   HCT 49.3 08/04/2021 1105   PLT 231  08/04/2021 1105   MCV 88.0 08/04/2021 1105   MCH 29.8 08/04/2021 1105   MCHC 33.9 08/04/2021 1105   RDW 13.1 08/04/2021 1105   LYMPHSABS 3.0 12/10/2016 1434   MONOABS 0.5 12/10/2016 1434   EOSABS 0.1 12/10/2016 1434   BASOSABS 0.1 12/10/2016 1434    Hgb A1C Lab Results  Component Value Date   HGBA1C 5.5 08/04/2021           Assessment & Plan:      RTC in 6 months for your annual exam Webb Silversmith, NP

## 2022-04-30 NOTE — Assessment & Plan Note (Signed)
C-Met and lipid profile today Encouraged him to consume a low saturated fat diet

## 2022-04-30 NOTE — Assessment & Plan Note (Signed)
Will check testosterone level today Continue testosterone injections

## 2022-04-30 NOTE — Assessment & Plan Note (Signed)
Encourage diet and exercise for weight loss 

## 2022-04-30 NOTE — Patient Instructions (Signed)

## 2022-04-30 NOTE — Assessment & Plan Note (Signed)
Controlled on lisinopril HCT Reinforced DASH diet and exercise for weight loss C-Met today 

## 2022-04-30 NOTE — Assessment & Plan Note (Signed)
Stable on bupropion Support offered

## 2022-04-30 NOTE — Assessment & Plan Note (Signed)
Continue Ambien as needed 

## 2022-05-01 LAB — LIPID PANEL
Cholesterol: 136 mg/dL (ref <200)
HDL: 20 mg/dL — ABNORMAL LOW (ref >=40)
LDL Cholesterol (Calc): 80 mg/dL (calc)
Non-HDL Cholesterol (Calc): 116 mg/dL (calc) (ref <130)
Total CHOL/HDL Ratio: 6.8 (calc) — ABNORMAL HIGH (ref <5.0)
Triglycerides: 299 mg/dL — ABNORMAL HIGH (ref <150)

## 2022-05-01 LAB — CBC
HCT: 48.9 % (ref 38.5–50.0)
Hemoglobin: 17.5 g/dL — ABNORMAL HIGH (ref 13.2–17.1)
MCH: 30.7 pg (ref 27.0–33.0)
MCHC: 35.8 g/dL (ref 32.0–36.0)
MCV: 85.8 fL (ref 80.0–100.0)
MPV: 9.9 fL (ref 7.5–12.5)
Platelets: 261 10*3/uL (ref 140–400)
RBC: 5.7 10*6/uL (ref 4.20–5.80)
RDW: 13.6 % (ref 11.0–15.0)
WBC: 7.1 10*3/uL (ref 3.8–10.8)

## 2022-05-01 LAB — COMPLETE METABOLIC PANEL WITH GFR
AG Ratio: 1.6 (calc) (ref 1.0–2.5)
ALT: 37 U/L (ref 9–46)
AST: 24 U/L (ref 10–40)
Albumin: 4.7 g/dL (ref 3.6–5.1)
Alkaline phosphatase (APISO): 78 U/L (ref 36–130)
BUN: 18 mg/dL (ref 7–25)
CO2: 30 mmol/L (ref 20–32)
Calcium: 9.9 mg/dL (ref 8.6–10.3)
Chloride: 100 mmol/L (ref 98–110)
Creat: 1.08 mg/dL (ref 0.60–1.29)
Globulin: 2.9 g/dL (calc) (ref 1.9–3.7)
Glucose, Bld: 90 mg/dL (ref 65–99)
Potassium: 4 mmol/L (ref 3.5–5.3)
Sodium: 139 mmol/L (ref 135–146)
Total Bilirubin: 1.3 mg/dL — ABNORMAL HIGH (ref 0.2–1.2)
Total Protein: 7.6 g/dL (ref 6.1–8.1)
eGFR: 85 mL/min/{1.73_m2} (ref >=60)

## 2022-05-01 LAB — TESTOSTERONE: Testosterone: 733 ng/dL (ref 250–827)

## 2022-05-01 LAB — HEMOGLOBIN A1C
Hgb A1c MFr Bld: 5.9 % of total Hgb — ABNORMAL HIGH (ref <5.7)
Mean Plasma Glucose: 123 mg/dL
eAG (mmol/L): 6.8 mmol/L

## 2022-05-08 ENCOUNTER — Telehealth: Payer: Self-pay

## 2022-05-08 NOTE — Telephone Encounter (Signed)
Copied from [place] [id number]. Topic: General - Inquiry >> May 07, 2022  1:00 PM Penni Bombard wrote: Reason for CRM: pt's wife called saying the pharmacy told her that Patrick Robertson needs to send in a PA foe his testosterone.  [facility] [address]  [facility]#  [phone]

## 2022-05-08 NOTE — Telephone Encounter (Signed)
PA sent through Cover My Meds.    Thanks,   -Sevyn Markham  

## 2022-05-11 NOTE — Telephone Encounter (Signed)
Pt's spouse called repoting that insurance needs a prior auth with evidence of hypogonadism. They need to specify his need for this medication. It is getting denied for lacking enough information.  Pt's spouse says the provider line is quicker than fax, please advise.   [place]

## 2022-05-13 NOTE — Telephone Encounter (Signed)
I called [facility].  They are going to fax a new PA form.

## 2022-05-15 NOTE — Telephone Encounter (Signed)
[facility] 

## 2022-06-03 ENCOUNTER — Telehealth: Payer: Self-pay | Admitting: *Deleted

## 2022-06-03 NOTE — Telephone Encounter (Signed)
  Chief Complaint: Results Symptoms: NA Frequency: nA Pertinent Negatives: Patient denies na Disposition: []$ ED /[]$ Urgent Care (no appt availability in office) / []$ Appointment(In office/virtual)/ []$  [facility]/ []$ Home Care/ []$ Refused Recommended Disposition /[]$ [place] [facility]/ []$  Follow-up with PCP Additional Notes: Pt's wife on DPR, calling for lab results from 04/30/22. Reviewed, verbalizes understanding.

## 2022-06-04 ENCOUNTER — Telehealth: Payer: Self-pay

## 2022-06-04 NOTE — Telephone Encounter (Signed)
Copied from [place] [phone]. Topic: General - Other >> Jun 03, 2022  4:11 PM Ludger Nutting wrote: Patient's wife called to advise pcp that their insurance is denying coverage for testosterone enanthate (DELATESTRYL) 200 MG/ML injection. Patient's wife states that insurance is requesting a letter of necessity (empty cell syndrome). Please advise patient.

## 2022-06-05 NOTE — Telephone Encounter (Signed)
LMTCB 06/05/2022.    PEC please advise if pt has or where we can get medical records documenting empty sella syndrome.     Thanks,   -Mickel Baas

## 2022-06-05 NOTE — Telephone Encounter (Signed)
Where did she get the diagnosis of empty sella syndrome?  There is no evidence of this in his chart.

## 2022-06-09 ENCOUNTER — Encounter: Payer: Self-pay | Admitting: Internal Medicine

## 2022-06-15 ENCOUNTER — Other Ambulatory Visit: Payer: Self-pay | Admitting: Internal Medicine

## 2022-06-16 NOTE — Telephone Encounter (Signed)
Requested medications are due for refill today.  yes  Requested medications are on the active medications list.  yes  Last refill. 04/09/2022 #30 0 rf  Future visit scheduled.   yes  Notes to clinic.  Refill not delegated.    Requested Prescriptions  Pending Prescriptions Disp Refills   zolpidem (AMBIEN) 10 MG tablet [Pharmacy Med Name: ZOLPIDEM TARTRATE 10 MG TABLET] 30 tablet 0    Sig: TAKE 1 TABLET BY MOUTH EVERY DAY AT BEDTIME AS NEEDED     Not Delegated - Psychiatry:  Anxiolytics/Hypnotics Failed - 06/15/2022  7:53 AM      Failed - This refill cannot be delegated      Failed - Urine Drug Screen completed in last 360 days      Passed - Valid encounter within last 6 months    Recent Outpatient Visits           1 month ago Primary male hypogonadism   [facility] [place], [place], [place]   10 months ago Encounter for general adult medical examination with abnormal findings   [facility] [place], Coralie Keens, NP       Future Appointments             In 4 months Baity, Coralie Keens, NP [facility]            Signed Prescriptions Disp Refills   buPROPion (WELLBUTRIN XL) 150 MG 24 hr tablet 90 tablet 1    Sig: TAKE 1 TABLET BY MOUTH EVERY DAY IN THE MORNING     Psychiatry: Antidepressants - bupropion Passed - 06/15/2022  7:53 AM      Passed - Cr in normal range and within 360 days    Creat  Date Value Ref Range Status  04/30/2022 1.08 0.60 - 1.29 mg/dL Final         Passed - AST in normal range and within 360 days    AST  Date Value Ref Range Status  04/30/2022 24 10 - 40 U/L Final         Passed - ALT in normal range and within 360 days    ALT  Date Value Ref Range Status  04/30/2022 37 9 - 46 U/L Final         Passed - Completed PHQ-2 or PHQ-9 in the last 360 days      Passed - Last BP in normal range    BP Readings from Last 1 Encounters:  04/30/22 135/85         Passed -  Valid encounter within last 6 months    Recent Outpatient Visits           1 month ago Primary male hypogonadism   [facility] [place], Coralie Keens, NP   10 months ago Encounter for general adult medical examination with abnormal findings   [facility] [place], Coralie Keens, NP       Future Appointments             In 4 months Baity, Coralie Keens, NP [facility]             lisinopril-hydrochlorothiazide (ZESTORETIC) 20-12.5 MG tablet 90 tablet 1    Sig: TAKE 1 TABLET BY MOUTH EVERY DAY     Cardiovascular:  ACEI + Diuretic Combos Passed - 06/15/2022  7:53 AM  Passed - Na in normal range and within 180 days    Sodium  Date Value Ref Range Status  04/30/2022 139 135 - 146 mmol/L Final         Passed - K in normal range and within 180 days    Potassium  Date Value Ref Range Status  04/30/2022 4.0 3.5 - 5.3 mmol/L Final         Passed - Cr in normal range and within 180 days    Creat  Date Value Ref Range Status  04/30/2022 1.08 0.60 - 1.29 mg/dL Final         Passed - eGFR is 30 or above and within 180 days    GFR  Date Value Ref Range Status  07/09/2020 70.68 >60.00 mL/min Final    Comment:    Calculated using the CKD-EPI Creatinine Equation (2021)   eGFR  Date Value Ref Range Status  04/30/2022 85 > OR = 60 mL/min/1.35m Final         Passed - Patient is not pregnant      Passed - Last BP in normal range    BP Readings from Last 1 Encounters:  04/30/22 135/85         Passed - Valid encounter within last 6 months    Recent Outpatient Visits           1 month ago [facility]ogonad[facility]   10 months ago Encounter for general adult medical examination with abnormal findings   [facility]       Future Appointments             In 4 months Baity, [facility]

## 2022-06-16 NOTE — Telephone Encounter (Signed)
Requested medications are due for refill today.  yes   Requested medications are on the active medications list.  yes   Last refill. 04/09/2022 #30 0 rf   Future visit scheduled.   yes   Notes to clinic.  Refill not delegated.  Requested Prescriptions  Pending Prescriptions Disp Refills   zolpidem (AMBIEN) 10 MG tablet [Pharmacy Med Name: ZOLPIDEM TARTRATE 10 MG TABLET] 30 tablet 0    Sig: TAKE 1 TABLET BY MOUTH EVERY DAY AT BEDTIME AS NEEDED     Not Delegated - Psychiatry:  Anxiolytics/Hypnotics Failed - 06/16/2022  1:02 PM      Failed - This refill cannot be delegated      Failed - Urine Drug Screen completed in last 360 days      Passed - Valid encounter within last 6 months    Recent Outpatient Visits           1 month ago Primary male hypogonadism   [facility] [place], [place], NP   10 months ago Encounter for general adult medical examination with abnormal findings   [facility] [place], Coralie Keens, NP       Future Appointments             In 4 months Baity, Coralie Keens, NP [facility]            Signed Prescriptions Disp Refills   buPROPion (WELLBUTRIN XL) 150 MG 24 hr tablet 90 tablet 1    Sig: TAKE 1 TABLET BY MOUTH EVERY DAY IN THE MORNING     Psychiatry: Antidepressants - bupropion Passed - 06/15/2022  7:53 AM      Passed - Cr in normal range and within 360 days    Creat  Date Value Ref Range Status  04/30/2022 1.08 0.60 - 1.29 mg/dL Final         Passed - AST in normal range and within 360 days    AST  Date Value Ref Range Status  04/30/2022 24 10 - 40 U/L Final         Passed - ALT in normal range and within 360 days    ALT  Date Value Ref Range Status  04/30/2022 37 9 - 46 U/L Final         Passed - Completed PHQ-2 or PHQ-9 in the last 360 days      Passed - Last BP in normal range    BP Readings from Last 1 Encounters:  04/30/22 135/85         Passed -  Valid encounter within last 6 months    Recent Outpatient Visits           1 month ago Primary male hypogonadism   [facility] [place], Coralie Keens, NP   10 months ago Encounter for general adult medical examination with abnormal findings   [facility] [place], Coralie Keens, NP       Future Appointments             In 4 months Baity, Coralie Keens, NP [facility]             lisinopril-hydrochlorothiazide (ZESTORETIC) 20-12.5 MG tablet 90 tablet 1    Sig: TAKE 1 TABLET BY MOUTH EVERY DAY     Cardiovascular:  ACEI + Diuretic Combos Passed - 06/15/2022  7:53 AM  Passed - Na in normal range and within 180 days    Sodium  Date Value Ref Range Status  04/30/2022 139 135 - 146 mmol/L Final         Passed - K in normal range and within 180 days    Potassium  Date Value Ref Range Status  04/30/2022 4.0 3.5 - 5.3 mmol/L Final         Passed - Cr in normal range and within 180 days    Creat  Date Value Ref Range Status  04/30/2022 1.08 0.60 - 1.29 mg/dL Final         Passed - eGFR is 30 or above and within 180 days    GFR  Date Value Ref Range Status  07/09/2020 70.68 >60.00 mL/min Final    Comment:    Calculated using the CKD-EPI Creatinine Equation (2021)   eGFR  Date Value Ref Range Status  04/30/2022 85 > OR = 60 mL/min/1.52m Final         Passed - Patient is not pregnant      Passed - Last BP in normal range    BP Readings from Last 1 Encounters:  04/30/22 135/85         Passed - Valid encounter within last 6 months    Recent Outpatient Visits           1 month ago Primary male hypogonadism   [facility]   10 months ago Encounter for general adult medical examination with abnormal findings   [facility]       Future Appointments             In 4 months Baity, [facility]

## 2022-06-16 NOTE — Telephone Encounter (Signed)
Requested Prescriptions  Pending Prescriptions Disp Refills   zolpidem (AMBIEN) 10 MG tablet [Pharmacy Med Name: ZOLPIDEM TARTRATE 10 MG TABLET] 30 tablet 0    Sig: TAKE 1 TABLET BY MOUTH EVERY DAY AT BEDTIME AS NEEDED     Not Delegated - Psychiatry:  Anxiolytics/Hypnotics Failed - 06/15/2022  7:53 AM      Failed - This refill cannot be delegated      Failed - Urine Drug Screen completed in last 360 days      Passed - Valid encounter within last 6 months    Recent Outpatient Visits           1 month ago Primary male hypogonadism   [facility] [place], [place], NP   10 months ago Encounter for general adult medical examination with abnormal findings   [facility] [place], Coralie Keens, NP       Future Appointments             In 4 months Baity, Coralie Keens, NP [facility]             buPROPion (WELLBUTRIN XL) 150 MG 24 hr tablet [Pharmacy Med Name: BUPROPION HCL XL 150 MG TABLET] 90 tablet 1    Sig: TAKE 1 TABLET BY MOUTH EVERY DAY IN THE MORNING     Psychiatry: Antidepressants - bupropion Passed - 06/15/2022  7:53 AM      Passed - Cr in normal range and within 360 days    Creat  Date Value Ref Range Status  04/30/2022 1.08 0.60 - 1.29 mg/dL Final         Passed - AST in normal range and within 360 days    AST  Date Value Ref Range Status  04/30/2022 24 10 - 40 U/L Final         Passed - ALT in normal range and within 360 days    ALT  Date Value Ref Range Status  04/30/2022 37 9 - 46 U/L Final         Passed - Completed PHQ-2 or PHQ-9 in the last 360 days      Passed - Last BP in normal range    BP Readings from Last 1 Encounters:  04/30/22 135/85         Passed - Valid encounter within last 6 months    Recent Outpatient Visits           1 month ago Primary male hypogonadism   [facility] [place], Coralie Keens, NP   10 months ago Encounter for general  adult medical examination with abnormal findings   [facility] [place], Coralie Keens, NP       Future Appointments             In 4 months Baity, Coralie Keens, NP [facility]             lisinopril-hydrochlorothiazide (ZESTORETIC) 20-12.5 MG tablet [Pharmacy Med Name: LISINOPRIL-HCTZ 20-12.5 MG TAB] 90 tablet 1    Sig: TAKE 1 TABLET BY MOUTH EVERY DAY     Cardiovascular:  ACEI + Diuretic Combos Passed - 06/15/2022  7:53 AM      Passed - Na in normal range and within 180 days    Sodium  Date Value Ref Range Status  04/30/2022 139 135 - 146 mmol/L Final  Passed - K in normal range and within 180 days    Potassium  Date Value Ref Range Status  04/30/2022 4.0 3.5 - 5.3 mmol/L Final         Passed - Cr in normal range and within 180 days    Creat  Date Value Ref Range Status  04/30/2022 1.08 0.60 - 1.29 mg/dL Final         Passed - eGFR is 30 or above and within 180 days    GFR  Date Value Ref Range Status  07/09/2020 70.68 >60.00 mL/min Final    Comment:    Calculated using the CKD-EPI Creatinine Equation (2021)   eGFR  Date Value Ref Range Status  04/30/2022 85 > OR = 60 mL/min/1.29m Final         Passed - Patient is not pregnant      Passed - Last BP in normal range    BP Readings from Last 1 Encounters:  04/30/22 135/85         Passed - Valid encounter within last 6 months    Recent Outpatient Visits           1 month ago Primary male hypogonadism   [facility]   10 months ago Encounter for general adult medical examination with abnormal findings   [facility]       Future Appointments             In 4 months Baity, RC[facility]

## 2022-07-19 ENCOUNTER — Other Ambulatory Visit: Payer: Self-pay | Admitting: Internal Medicine

## 2022-07-21 NOTE — Telephone Encounter (Signed)
Requested medication (s) are due for refill today: Yes  Requested medication (s) are on the active medication list: Yes  Last refill:  06/16/22  Future visit scheduled: Yes  Notes to clinic:  See request.    Requested Prescriptions  Pending Prescriptions Disp Refills   zolpidem (AMBIEN) 10 MG tablet [Pharmacy Med Name: ZOLPIDEM TARTRATE 10 MG TABLET] 30 tablet 0    Sig: TAKE 1 TABLET BY MOUTH EVERY DAY AT BEDTIME AS NEEDED     Not Delegated - Psychiatry:  Anxiolytics/Hypnotics Failed - 07/19/2022 10:49 PM      Failed - This refill cannot be delegated      Failed - Urine Drug Screen completed in last 360 days      Passed - Valid encounter within last 6 months    Recent Outpatient Visits           2 months ago Primary male hypogonadism   [facility] [place], Salvadore [place], NP   11 months ago Encounter for general adult medical examination with abnormal findings   [place] [facility] [place], Salvadore Oxford, NP       Future Appointments             In 3 months Baity, Salvadore [place], NP [facility], [facility]

## 2022-09-01 ENCOUNTER — Other Ambulatory Visit: Payer: Self-pay | Admitting: Internal Medicine

## 2022-09-01 DIAGNOSIS — E291 Testicular hypofunction: Secondary | ICD-10-CM

## 2022-09-01 NOTE — Telephone Encounter (Signed)
Requested medication (s) are due for refill today - yes  Requested medication (s) are on the active medication list -yes  Future visit scheduled -yes  Last refill: 04/30/22 5ml  Notes to clinic: off protocol- provider review   Requested Prescriptions  Pending Prescriptions Disp Refills   testosterone enanthate (DELATESTRYL) 200 MG/ML injection [Pharmacy Med Name: TESTOSTERON ENAN 1,000 MG/5 ML] 5 mL 0    Sig: Inject 0.5 mLs (100 mg total) into the muscle once a week. FOR INTRAMUSCULAR USE ONLY     Off-Protocol Failed - 09/01/2022  6:33 AM      Failed - Medication not assigned to a protocol, review manually.      Passed - Valid encounter within last 12 months    Recent Outpatient Visits           4 months ago Primary male hypogonadism   [facility] [place], Salvadore [place], NP   1 year ago Encounter for general adult medical examination with abnormal findings   [facility] [place], Salvadore Oxford, NP       Future Appointments             In 2 months [place], Salvadore Oxford, NP [place], [facility]               Requested Prescriptions  Pending Prescriptions Disp Refills   testosterone enanthate (DELATESTRYL) 200 MG/ML injection [Pharmacy Med Name: TESTOSTERON ENAN 1,000 MG/5 ML] 5 mL 0    Sig: Inject 0.5 mLs (100 mg total) into the muscle once a week. FOR INTRAMUSCULAR USE ONLY     Off-Protocol Failed - 09/01/2022  6:33 AM      Failed - Medication not assigned to a protocol, review manually.      Passed - Valid encounter within last 12 months    Recent Outpatient Visits           4 months ago Primary male hypogonadism   [place] [facility] [place], Salvadore Oxford, NP   1 year ago Encounter for general adult medical examination with abnormal findings   [facility] [place], Salvadore Oxford, NP       Future Appointments             In 2 months Baity, Salvadore Oxford, NP [facility]

## 2022-09-09 ENCOUNTER — Other Ambulatory Visit: Payer: Self-pay

## 2022-09-10 MED ORDER — ZOLPIDEM TARTRATE 10 MG PO TABS: 10.0000 mg | ORAL_TABLET | Freq: Every evening | ORAL | 0 refills | Status: DC | PRN

## 2022-11-02 ENCOUNTER — Encounter: Payer: Self-pay | Admitting: Internal Medicine

## 2022-11-02 ENCOUNTER — Ambulatory Visit (INDEPENDENT_AMBULATORY_CARE_PROVIDER_SITE_OTHER): Payer: No Typology Code available for payment source | Admitting: Internal Medicine

## 2022-11-02 ENCOUNTER — Ambulatory Visit: Payer: No Typology Code available for payment source | Admitting: Internal Medicine

## 2022-11-02 VITALS — BP 150/99 | HR 93 | Ht 68.0 in | Wt 189.0 lb

## 2022-11-02 DIAGNOSIS — Z0001 Encounter for general adult medical examination with abnormal findings: Secondary | ICD-10-CM

## 2022-11-02 DIAGNOSIS — E781 Pure hyperglyceridemia: Secondary | ICD-10-CM | POA: Diagnosis not present

## 2022-11-02 DIAGNOSIS — I10 Essential (primary) hypertension: Secondary | ICD-10-CM

## 2022-11-02 DIAGNOSIS — R7303 Prediabetes: Secondary | ICD-10-CM

## 2022-11-02 DIAGNOSIS — Z6828 Body mass index (BMI) 28.0-28.9, adult: Secondary | ICD-10-CM

## 2022-11-02 DIAGNOSIS — E663 Overweight: Secondary | ICD-10-CM

## 2022-11-02 LAB — CBC
HCT: 50.7 % — ABNORMAL HIGH (ref 38.5–50.0)
Hemoglobin: 17.7 g/dL — ABNORMAL HIGH (ref 13.2–17.1)
MCH: 30.4 pg (ref 27.0–33.0)
MCV: 87.1 fL (ref 80.0–100.0)
Platelets: 279 10*3/uL (ref 140–400)

## 2022-11-02 MED ORDER — OLMESARTAN-AMLODIPINE-HCTZ 20-5-12.5 MG PO TABS: 1.0000 | ORAL_TABLET | Freq: Every day | ORAL | 0 refills | Status: DC

## 2022-11-02 NOTE — Assessment & Plan Note (Signed)
Uncontrolled on lisinopril HCT Will switch to amlodipine-olmesartan-HCT 08-31-10.5 Reinforced DASH diet and exercise for weight loss

## 2022-11-02 NOTE — Patient Instructions (Signed)
Health Maintenance, Male Adopting a healthy lifestyle and getting preventive care are important in promoting health and wellness. Ask your health care provider about: The right schedule for you to have regular tests and exams. Things you can do on your own to prevent diseases and keep yourself healthy. What should I know about diet, weight, and exercise? Eat a healthy diet  Eat a diet that includes plenty of vegetables, fruits, low-fat dairy products, and lean protein. Do not eat a lot of foods that are high in solid fats, added sugars, or sodium. Maintain a healthy weight Body mass index (BMI) is a measurement that can be used to identify possible weight problems. It estimates body fat based on height and weight. Your health care provider can help determine your BMI and help you achieve or maintain a healthy weight. Get regular exercise Get regular exercise. This is one of the most important things you can do for your health. Most adults should: Exercise for at least 150 minutes each week. The exercise should increase your heart rate and make you sweat (moderate-intensity exercise). Do strengthening exercises at least twice a week. This is in addition to the moderate-intensity exercise. Spend less time sitting. Even light physical activity can be beneficial. Watch cholesterol and blood lipids Have your blood tested for lipids and cholesterol at 48 years of age, then have this test every 5 years. You may need to have your cholesterol levels checked more often if: Your lipid or cholesterol levels are high. You are older than 48 years of age. You are at high risk for heart disease. What should I know about cancer screening? Many types of cancers can be detected early and may often be prevented. Depending on your health history and family history, you may need to have cancer screening at various ages. This may include screening for: Colorectal cancer. Prostate cancer. Skin cancer. Lung  cancer. What should I know about heart disease, diabetes, and high blood pressure? Blood pressure and heart disease High blood pressure causes heart disease and increases the risk of stroke. This is more likely to develop in people who have high blood pressure readings or are overweight. Talk with your health care provider about your target blood pressure readings. Have your blood pressure checked: Every 3-5 years if you are 18-39 years of age. Every year if you are 40 years old or older. If you are between the ages of 65 and 75 and are a current or former smoker, ask your health care provider if you should have a one-time screening for abdominal aortic aneurysm (AAA). Diabetes Have regular diabetes screenings. This checks your fasting blood sugar level. Have the screening done: Once every three years after age 45 if you are at a normal weight and have a low risk for diabetes. More often and at a younger age if you are overweight or have a high risk for diabetes. What should I know about preventing infection? Hepatitis B If you have a higher risk for hepatitis B, you should be screened for this virus. Talk with your health care provider to find out if you are at risk for hepatitis B infection. Hepatitis C Blood testing is recommended for: Everyone born from 1945 through 1965. Anyone with known risk factors for hepatitis C. Sexually transmitted infections (STIs) You should be screened each year for STIs, including gonorrhea and chlamydia, if: You are sexually active and are younger than 48 years of age. You are older than 48 years of age and your   health care provider tells you that you are at risk for this type of infection. Your sexual activity has changed since you were last screened, and you are at increased risk for chlamydia or gonorrhea. Ask your health care provider if you are at risk. Ask your health care provider about whether you are at high risk for HIV. Your health care provider  may recommend a prescription medicine to help prevent HIV infection. If you choose to take medicine to prevent HIV, you should first get tested for HIV. You should then be tested every 3 months for as long as you are taking the medicine. Follow these instructions at home: Alcohol use Do not drink alcohol if your health care provider tells you not to drink. If you drink alcohol: Limit how much you have to 0-2 drinks a day. Know how much alcohol is in your drink. In the [place] one drink equals one 12 oz bottle of beer (355 mL), one 5 oz glass of wine (148 mL), or one 1 oz glass of hard liquor (44 mL). Lifestyle Do not use any products that contain nicotine or tobacco. These products include cigarettes, chewing tobacco, and vaping devices, such as e-cigarettes. If you need help quitting, ask your health care provider. Do not use street drugs. Do not share needles. Ask your health care provider for help if you need support or information about quitting drugs. General instructions Schedule regular health, dental, and eye exams. Stay current with your vaccines. Tell your health care provider if: You often feel depressed. You have ever been abused or do not feel safe at home. Summary Adopting a healthy lifestyle and getting preventive care are important in promoting health and wellness. Follow your health care provider's instructions about healthy diet, exercising, and getting tested or screened for diseases. Follow your health care provider's instructions on monitoring your cholesterol and blood pressure. This information is not intended to replace advice given to you by your health care provider. Make sure you discuss any questions you have with your health care provider. Document Revised: 08/19/2020 Document Reviewed: 08/19/2020 Elsevier Patient Education  2024 [employer]  

## 2022-11-02 NOTE — Assessment & Plan Note (Signed)
Encouraged diet and exercise for weight loss ?

## 2022-11-02 NOTE — Progress Notes (Signed)
Subjective:    Patient ID: Patrick Robertson, male    DOB: Sep 03, 1975, 47 y.o.   MRN: [id number]  HPI  Patient presents to clinic today for his annual exam.  Flu: 01/2022 Tetanus: 06/2020 COVID: X 2 Colon screening: Never Vision screening: annually Dentist: biannually  Diet: He does eat meat. He consumes fruits and veggies. He does eat some fried foods. He drinks mostly water. Exercise: spin class, walking  Review of Systems     Past Medical History:  Diagnosis Date   Basal cell carcinoma (BCC) of chin    Hypertension     Current Outpatient Medications  Medication Sig Dispense Refill   buPROPion (WELLBUTRIN XL) 150 MG 24 hr tablet TAKE 1 TABLET BY MOUTH EVERY DAY IN THE MORNING 90 tablet 1   lisinopril-hydrochlorothiazide (ZESTORETIC) 20-12.5 MG tablet TAKE 1 TABLET BY MOUTH EVERY DAY 90 tablet 1   Multiple Vitamin (MULTIVITAMIN) tablet Take 1 tablet by mouth daily.     testosterone enanthate (DELATESTRYL) 200 MG/ML injection INJECT 0.5 MLS (100 MG TOTAL) INTO THE MUSCLE ONCE A WEEK. FOR INTRAMUSCULAR USE ONLY 5 mL 0   zolpidem (AMBIEN) 10 MG tablet Take 1 tablet (10 mg total) by mouth at bedtime as needed for sleep. 30 tablet 0   No current facility-administered medications for this visit.    No Known Allergies  Family History  Problem Relation Age of Onset   Hypertension Mother    Hypertension Father    Hypertension Maternal Grandmother    Hypertension Maternal Grandfather    Cancer Maternal Grandfather    Hypertension Paternal Grandmother    Hypertension Paternal Grandfather    Cancer Paternal Grandfather    Hypertension Brother     Social History   Socioeconomic History   Marital status: Married    Spouse name: Not on file   Number of children: Not on file   Years of education: Not on file   Highest education level: Not on file  Occupational History   Not on file  Tobacco Use   Smoking status: Never   Smokeless tobacco: Never  Vaping Use    Vaping status: Never Used  Substance and Sexual Activity   Alcohol use: No    Alcohol/week: 1.0 standard drink of alcohol    Types: 1 Cans of beer per week   Drug use: No   Sexual activity: Yes  Other Topics Concern   Not on file  Social History Narrative   Not on file   Social Determinants of Health   Financial Resource Strain: Not on file  Food Insecurity: Not on file  Transportation Needs: Not on file  Physical Activity: Not on file  Stress: Not on file  Social Connections: Not on file  Intimate Partner Violence: Not on file     Constitutional: Denies fever, malaise, fatigue, headache or abrupt weight changes.  HEENT: Denies eye pain, eye redness, ear pain, ringing in the ears, wax buildup, runny nose, nasal congestion, bloody nose, or sore throat. Respiratory: Denies difficulty breathing, shortness of breath, cough or sputum production.   Cardiovascular: Denies chest pain, chest tightness, palpitations or swelling in the hands or feet.  Gastrointestinal: Denies abdominal pain, bloating, constipation, diarrhea or blood in the stool.  GU: Denies urgency, frequency, pain with urination, burning sensation, blood in urine, odor or discharge. Musculoskeletal: Denies decrease in range of motion, difficulty with gait, muscle pain or joint pain and swelling.  Skin: Denies redness, rashes, lesions or ulcercations.  Neurological: Patient reports  insomnia.  Denies dizziness, difficulty with memory, difficulty with speech or problems with balance and coordination.  Psych: Patient has a history of anxiety and depression.  Denies SI/HI.  No other specific complaints in a complete review of systems (except as listed in HPI above).  Objective:   Physical Exam  BP (!) 158/100   Pulse 93   Ht 5\' 8"  (1.727 m)   Wt 189 lb (85.7 kg)   SpO2 98%   BMI 28.74 kg/m   Wt Readings from Last 3 Encounters:  04/30/22 186 lb (84.4 kg)  02/28/22 185 lb (83.9 kg)  08/04/21 188 lb 9.6 oz (85.5 kg)     General: Appears his stated age, overweight in NAD. Skin: Warm, dry and intact. HEENT: Head: normal shape and size; Eyes: sclera white, no icterus, conjunctiva pink, PERRLA and EOMs intact;  Neck:  Neck supple, trachea midline. No masses, lumps or thyromegaly present.  Cardiovascular: Normal rate and rhythm. S1,S2 noted.  No murmur, rubs or gallops noted. No JVD or BLE edema. Pulmonary/Chest: Normal effort and positive vesicular breath sounds. No respiratory distress. No wheezes, rales or ronchi noted.  Abdomen: Normal bowel sounds. Musculoskeletal: Strength 5/5 BUE/BLE.  No difficulty with gait.  Neurological: Alert and oriented. Cranial nerves II-XII grossly intact. Coordination normal.  Psychiatric: Mood and affect normal. Behavior is normal. Judgment and thought content normal.     BMET    Component Value Date/Time   NA 139 04/30/2022 1013   K 4.0 04/30/2022 1013   CL 100 04/30/2022 1013   CO2 30 04/30/2022 1013   GLUCOSE 90 04/30/2022 1013   BUN 18 04/30/2022 1013   CREATININE 1.08 04/30/2022 1013   CALCIUM 9.9 04/30/2022 1013    Lipid Panel     Component Value Date/Time   CHOL 136 04/30/2022 1013   TRIG 299 (H) 04/30/2022 1013   HDL 20 (L) 04/30/2022 1013   CHOLHDL 6.8 (H) 04/30/2022 1013   VLDL 55.2 (H) 07/09/2020 0913   LDLCALC 80 04/30/2022 1013    CBC    Component Value Date/Time   WBC 7.1 04/30/2022 1013   RBC 5.70 04/30/2022 1013   HGB 17.5 (H) 04/30/2022 1013   HCT 48.9 04/30/2022 1013   PLT 261 04/30/2022 1013   MCV 85.8 04/30/2022 1013   MCH 30.7 04/30/2022 1013   MCHC 35.8 04/30/2022 1013   RDW 13.6 04/30/2022 1013   LYMPHSABS 3.0 12/10/2016 1434   MONOABS 0.5 12/10/2016 1434   EOSABS 0.1 12/10/2016 1434   BASOSABS 0.1 12/10/2016 1434    Hgb A1C Lab Results  Component Value Date   HGBA1C 5.9 (H) 04/30/2022           Assessment & Plan:   Preventative health maintenance:  Encouraged him to get flu shot in the fall Tetanus  UTD Encouraged to get his COVID booster Cologuard ordered Encouraged him to consume a balanced diet and exercise regimen Advised him to see an eye doctor and dentist annually We will check CBC, c-Met, lipid, A1c today  RTC in 2 weeks, follow-up HTN, 6 months, follow-up chronic conditions Nicki Reaper, NP

## 2022-11-03 LAB — COMPLETE METABOLIC PANEL WITH GFR
AG Ratio: 2 (calc) (ref 1.0–2.5)
ALT: 38 U/L (ref 9–46)
AST: 32 U/L (ref 10–40)
Albumin: 4.9 g/dL (ref 3.6–5.1)
Alkaline phosphatase (APISO): 71 U/L (ref 36–130)
BUN: 19 mg/dL (ref 7–25)
CO2: 29 mmol/L (ref 20–32)
Calcium: 10.3 mg/dL (ref 8.6–10.3)
Chloride: 101 mmol/L (ref 98–110)
Creat: 1.23 mg/dL (ref 0.60–1.29)
Globulin: 2.5 g/dL (calc) (ref 1.9–3.7)
Glucose, Bld: 89 mg/dL (ref 65–139)
Potassium: 3.9 mmol/L (ref 3.5–5.3)
Sodium: 143 mmol/L (ref 135–146)
Total Bilirubin: 1.6 mg/dL — ABNORMAL HIGH (ref 0.2–1.2)
Total Protein: 7.4 g/dL (ref 6.1–8.1)
eGFR: 72 mL/min/{1.73_m2} (ref >=60)

## 2022-11-03 LAB — HEMOGLOBIN A1C
Hgb A1c MFr Bld: 5.9 % of total Hgb — ABNORMAL HIGH (ref <5.7)
Mean Plasma Glucose: 123 mg/dL
eAG (mmol/L): 6.8 mmol/L

## 2022-11-03 LAB — LIPID PANEL
Cholesterol: 136 mg/dL (ref <200)
HDL: 18 mg/dL — ABNORMAL LOW (ref >=40)
Non-HDL Cholesterol (Calc): 118 mg/dL (calc) (ref <130)
Total CHOL/HDL Ratio: 7.6 (calc) — ABNORMAL HIGH (ref <5.0)
Triglycerides: 623 mg/dL — ABNORMAL HIGH (ref <150)

## 2022-11-03 LAB — CBC
MCHC: 34.9 g/dL (ref 32.0–36.0)
MPV: 9.7 fL (ref 7.5–12.5)
RBC: 5.82 10*6/uL — ABNORMAL HIGH (ref 4.20–5.80)
RDW: 13.4 % (ref 11.0–15.0)
WBC: 8.2 10*3/uL (ref 3.8–10.8)

## 2022-11-04 ENCOUNTER — Telehealth: Payer: Self-pay

## 2022-11-04 NOTE — Telephone Encounter (Signed)
Notification from [employer] that this service is not likely covered by patients insurance using diagnosis code Z00.01.  No action required.

## 2022-11-06 ENCOUNTER — Other Ambulatory Visit: Payer: Self-pay | Admitting: Internal Medicine

## 2022-11-09 NOTE — Telephone Encounter (Signed)
Requested medication (s) are due for refill today: yes  Requested medication (s) are on the active medication list: yes  Last refill:  09/10/22  Future visit scheduled: yes  Notes to clinic:  Unable to refill per protocol, cannot delegate.      Requested Prescriptions  Pending Prescriptions Disp Refills   zolpidem (AMBIEN) 10 MG tablet [Pharmacy Med Name: ZOLPIDEM TARTRATE 10 MG TABLET] 30 tablet 0    Sig: TAKE 1 TABLET BY MOUTH AT BEDTIME AS NEEDED FOR SLEEP.     Not Delegated - Psychiatry:  Anxiolytics/Hypnotics Failed - 11/06/2022  8:23 PM      Failed - This refill cannot be delegated      Failed - Urine Drug Screen completed in last 360 days      Passed - Valid encounter within last 6 months    Recent Outpatient Visits           1 week ago Encounter for general adult medical examination with abnormal findings   [place] [facility] [place], Salvadore Oxford, NP   6 months ago Primary male hypogonadism   [place] [facility] [place], Salvadore Oxford, NP   1 year ago Encounter for general adult medical examination with abnormal findings   [place] [facility] [place], Salvadore Oxford, NP       Future Appointments             In 3 weeks Sampson Si, Salvadore Oxford, NP [facility], [facility]

## 2022-11-24 ENCOUNTER — Telehealth: Payer: Self-pay

## 2022-11-24 NOTE — Telephone Encounter (Signed)
Copied from CRM [id number]. Topic: General - Inquiry >> Nov 23, 2022 11:11 AM Lennox Pippins wrote: Madelin Rear with [employer] has called and stated they received an order for patient but there is an issue with it, please advise and call [employer] back @ [phone]

## 2022-11-24 NOTE — Telephone Encounter (Signed)
Patient follows w/ Patrick Robertson. Sounds like there was a call 11/04/22 that said Cologuard was not covered.  Saralyn Pilar, DO [facility] 11/24/2022, 11:06 AM

## 2022-11-24 NOTE — Telephone Encounter (Signed)
Pt advised.  He will discuss the colonoscopy at his follow up on 08/20.   Thanks,   -[name]

## 2022-11-24 NOTE — Telephone Encounter (Signed)
Let patient know that Cologuard is not covered.  Would he like referral to GI for colonoscopy?

## 2022-11-26 ENCOUNTER — Other Ambulatory Visit: Payer: Self-pay | Admitting: Internal Medicine

## 2022-11-26 DIAGNOSIS — Z1211 Encounter for screening for malignant neoplasm of colon: Secondary | ICD-10-CM

## 2022-12-01 ENCOUNTER — Ambulatory Visit: Payer: No Typology Code available for payment source | Admitting: Internal Medicine

## 2022-12-01 ENCOUNTER — Encounter: Payer: Self-pay | Admitting: Internal Medicine

## 2022-12-01 VITALS — BP 132/86 | HR 98 | Temp 97.1°F | Wt 187.0 lb

## 2022-12-01 DIAGNOSIS — E663 Overweight: Secondary | ICD-10-CM | POA: Diagnosis not present

## 2022-12-01 DIAGNOSIS — E291 Testicular hypofunction: Secondary | ICD-10-CM

## 2022-12-01 DIAGNOSIS — I10 Essential (primary) hypertension: Secondary | ICD-10-CM

## 2022-12-01 DIAGNOSIS — Z6828 Body mass index (BMI) 28.0-28.9, adult: Secondary | ICD-10-CM | POA: Diagnosis not present

## 2022-12-01 MED ORDER — TESTOSTERONE ENANTHATE 200 MG/ML IM SOLN
100.0000 mg | INTRAMUSCULAR | 0 refills | Status: DC
Start: 2022-12-01 — End: 2023-02-03

## 2022-12-01 MED ORDER — OLMESARTAN-AMLODIPINE-HCTZ 20-5-12.5 MG PO TABS: 1.0000 | ORAL_TABLET | Freq: Every day | ORAL | 1 refills | Status: DC

## 2022-12-01 NOTE — Assessment & Plan Note (Signed)
Controlled on amlodipine-olmesartan-HCTZ, refilled today Reinforced DASH diet and exercise for weight loss

## 2022-12-01 NOTE — Progress Notes (Signed)
Subjective:    Patient ID: Patrick Robertson, male    DOB: 10/13/1975, 47 y.o.   MRN: [id number]  HPI  Pt presents to the clinic today for 2 week follow up of HTN. At his last visit, his lisinopril hct was switched to amlodipine-olmesartan-hct. He has been taking the medication as prescribed. His BP today is 132/86. There is no ECG on file.  Review of Systems  Past Medical History:  Diagnosis Date   Basal cell carcinoma (BCC) of chin    Hypertension     Current Outpatient Medications  Medication Sig Dispense Refill   buPROPion (WELLBUTRIN XL) 150 MG 24 hr tablet TAKE 1 TABLET BY MOUTH EVERY DAY IN THE MORNING 90 tablet 1   Multiple Vitamin (MULTIVITAMIN) tablet Take 1 tablet by mouth daily.     Olmesartan-amLODIPine-HCTZ 20-5-12.5 MG TABS Take 1 tablet by mouth daily. 30 tablet 0   testosterone enanthate (DELATESTRYL) 200 MG/ML injection INJECT 0.5 MLS (100 MG TOTAL) INTO THE MUSCLE ONCE A WEEK. FOR INTRAMUSCULAR USE ONLY 5 mL 0   zolpidem (AMBIEN) 10 MG tablet TAKE 1 TABLET BY MOUTH AT BEDTIME AS NEEDED FOR SLEEP. 30 tablet 0   No current facility-administered medications for this visit.    No Known Allergies  Family History  Problem Relation Age of Onset   Hypertension Mother    Hypertension Father    Hypertension Maternal Grandmother    Hypertension Maternal Grandfather    Cancer Maternal Grandfather    Hypertension Paternal Grandmother    Hypertension Paternal Grandfather    Cancer Paternal Grandfather    Hypertension Brother     Social History   Socioeconomic History   Marital status: Married    Spouse name: Not on file   Number of children: Not on file   Years of education: Not on file   Highest education level: Not on file  Occupational History   Not on file  Tobacco Use   Smoking status: Never   Smokeless tobacco: Never  Vaping Use   Vaping status: Never Used  Substance and Sexual Activity   Alcohol use: No    Alcohol/week: 1.0 standard drink of  alcohol    Types: 1 Cans of beer per week   Drug use: No   Sexual activity: Yes  Other Topics Concern   Not on file  Social History Narrative   Not on file   Social Determinants of Health   Financial Resource Strain: Not on file  Food Insecurity: Not on file  Transportation Needs: Not on file  Physical Activity: Not on file  Stress: Not on file  Social Connections: Not on file  Intimate Partner Violence: Not on file     Constitutional: Denies fever, malaise, fatigue, headache or abrupt weight changes.  HEENT: Denies eye pain, eye redness, ear pain, ringing in the ears, wax buildup, runny nose, nasal congestion, bloody nose, or sore throat. Respiratory: Denies difficulty breathing, shortness of breath, cough or sputum production.   Cardiovascular: Denies chest pain, chest tightness, palpitations or swelling in the hands or feet.  Gastrointestinal: Denies abdominal pain, bloating, constipation, diarrhea or blood in the stool.  GU: Denies urgency, frequency, pain with urination, burning sensation, blood in urine, odor or discharge. Musculoskeletal: Denies decrease in range of motion, difficulty with gait, muscle pain or joint pain and swelling.  Skin: Denies redness, rashes, lesions or ulcercations.  Neurological: Pt reports insomnia. Denies dizziness, difficulty with memory, difficulty with speech or problems with balance and coordination.  Psych: Pt has a history of anxiety and depression. Denies SI/HI.  No other specific complaints in a complete review of systems (except as listed in HPI above).     Objective:   Physical Exam   BP 132/86 (BP Location: Right Arm, Patient Position: Sitting, Cuff Size: Large)   Pulse 98   Temp (!) 97.1 F (36.2 C) (Temporal)   Wt 187 lb (84.8 kg)   SpO2 99%   BMI 28.43 kg/m   Wt Readings from Last 3 Encounters:  11/02/22 189 lb (85.7 kg)  04/30/22 186 lb (84.4 kg)  02/28/22 185 lb (83.9 kg)    General: Appears his stated age,  overweight, in NAD. HEENT: Head: normal shape and size; Eyes: sclera white, no icterus, conjunctiva pink, PERRLA and EOMs intact;  Cardiovascular: Normal rate and rhythm. S1,S2 noted.  No murmur, rubs or gallops noted. No JVD or BLE edema. Pulmonary/Chest: Normal effort and positive vesicular breath sounds. No respiratory distress. No wheezes, rales or ronchi noted.  Musculoskeletal: No difficulty with gait.  Neurological: Alert and oriented. Coordination normal.      BMET    Component Value Date/Time   NA 143 11/02/2022 1509   K 3.9 11/02/2022 1509   CL 101 11/02/2022 1509   CO2 29 11/02/2022 1509   GLUCOSE 89 11/02/2022 1509   BUN 19 11/02/2022 1509   CREATININE 1.23 11/02/2022 1509   CALCIUM 10.3 11/02/2022 1509    Lipid Panel     Component Value Date/Time   CHOL 136 11/02/2022 1509   TRIG 623 (H) 11/02/2022 1509   HDL 18 (L) 11/02/2022 1509   CHOLHDL 7.6 (H) 11/02/2022 1509   VLDL 55.2 (H) 07/09/2020 0913   LDLCALC  11/02/2022 1509     Comment:     . LDL cholesterol not calculated. Triglyceride levels greater than 400 mg/dL invalidate calculated LDL results. . Reference range: <100 . Desirable range <100 mg/dL for primary prevention;   <70 mg/dL for patients with CHD or diabetic patients  with > or = 2 CHD risk factors. Marland Kitchen LDL-C is now calculated using the Martin-Hopkins  calculation, which is a validated novel method providing  better accuracy than the Friedewald equation in the  estimation of LDL-C.  Horald Pollen et al. Lenox Ahr. 2536;644(03): 2061-2068  (http://education.[employer].com/faq/FAQ164)     CBC    Component Value Date/Time   WBC 8.2 11/02/2022 1509   RBC 5.82 (H) 11/02/2022 1509   HGB 17.7 (H) 11/02/2022 1509   HCT 50.7 (H) 11/02/2022 1509   PLT 279 11/02/2022 1509   MCV 87.1 11/02/2022 1509   MCH 30.4 11/02/2022 1509   MCHC 34.9 11/02/2022 1509   RDW 13.4 11/02/2022 1509   LYMPHSABS 3.0 12/10/2016 1434   MONOABS 0.5 12/10/2016 1434    EOSABS 0.1 12/10/2016 1434   BASOSABS 0.1 12/10/2016 1434    Hgb A1C Lab Results  Component Value Date   HGBA1C 5.9 (H) 11/02/2022           Assessment & Plan:    RTC in 5 months, follow up chronic conditions Nicki Reaper, NP

## 2022-12-01 NOTE — Assessment & Plan Note (Signed)
Encourage diet and exercise for weight loss 

## 2022-12-01 NOTE — Patient Instructions (Signed)
Hypertension, Adult Hypertension is another name for high blood pressure. High blood pressure forces your heart to work harder to pump blood. This can cause problems over time. There are two numbers in a blood pressure reading. There is a top number (systolic) over a bottom number (diastolic). It is best to have a blood pressure that is below 120/80. What are the causes? The cause of this condition is not known. Some other conditions can lead to high blood pressure. What increases the risk? Some lifestyle factors can make you more likely to develop high blood pressure: Smoking. Not getting enough exercise or physical activity. Being overweight. Having too much fat, sugar, calories, or salt (sodium) in your diet. Drinking too much alcohol. Other risk factors include: Having any of these conditions: Heart disease. Diabetes. High cholesterol. Kidney disease. Obstructive sleep apnea. Having a family history of high blood pressure and high cholesterol. Age. The risk increases with age. Stress. What are the signs or symptoms? High blood pressure may not cause symptoms. Very high blood pressure (hypertensive crisis) may cause: Headache. Fast or uneven heartbeats (palpitations). Shortness of breath. Nosebleed. Vomiting or feeling like you may vomit (nauseous). Changes in how you see. Very bad chest pain. Feeling dizzy. Seizures. How is this treated? This condition is treated by making healthy lifestyle changes, such as: Eating healthy foods. Exercising more. Drinking less alcohol. Your doctor may prescribe medicine if lifestyle changes do not help enough and if: Your top number is above 130. Your bottom number is above 80. Your personal target blood pressure may vary. Follow these instructions at home: Eating and drinking  If told, follow the DASH eating plan. To follow this plan: Fill one half of your plate at each meal with fruits and vegetables. Fill one fourth of your plate  at each meal with whole grains. Whole grains include whole-wheat pasta, brown rice, and whole-grain bread. Eat or drink low-fat dairy products, such as skim milk or low-fat yogurt. Fill one fourth of your plate at each meal with low-fat (lean) proteins. Low-fat proteins include fish, chicken without skin, eggs, beans, and tofu. Avoid fatty meat, cured and processed meat, or chicken with skin. Avoid pre-made or processed food. Limit the amount of salt in your diet to less than 1,500 mg each day. Do not drink alcohol if: Your doctor tells you not to drink. You are pregnant, may be pregnant, or are planning to become pregnant. If you drink alcohol: Limit how much you have to: 0-1 drink a day for women. 0-2 drinks a day for men. Know how much alcohol is in your drink. In the [place] one drink equals one 12 oz bottle of beer (355 mL), one 5 oz glass of wine (148 mL), or one 1 oz glass of hard liquor (44 mL). Lifestyle  Work with your doctor to stay at a healthy weight or to lose weight. Ask your doctor what the best weight is for you. Get at least 30 minutes of exercise that causes your heart to beat faster (aerobic exercise) most days of the week. This may include walking, swimming, or biking. Get at least 30 minutes of exercise that strengthens your muscles (resistance exercise) at least 3 days a week. This may include lifting weights or doing Pilates. Do not smoke or use any products that contain nicotine or tobacco. If you need help quitting, ask your doctor. Check your blood pressure at home as told by your doctor. Keep all follow-up visits. Medicines Take over-the-counter and prescription medicines   only as told by your doctor. Follow directions carefully. Do not skip doses of blood pressure medicine. The medicine does not work as well if you skip doses. Skipping doses also puts you at risk for problems. Ask your doctor about side effects or reactions to medicines that you should watch  for. Contact a doctor if: You think you are having a reaction to the medicine you are taking. You have headaches that keep coming back. You feel dizzy. You have swelling in your ankles. You have trouble with your vision. Get help right away if: You get a very bad headache. You start to feel mixed up (confused). You feel weak or numb. You feel faint. You have very bad pain in your: Chest. Belly (abdomen). You vomit more than once. You have trouble breathing. These symptoms may be an emergency. Get help right away. Call 911. Do not wait to see if the symptoms will go away. Do not drive yourself to the hospital. Summary Hypertension is another name for high blood pressure. High blood pressure forces your heart to work harder to pump blood. For most people, a normal blood pressure is less than 120/80. Making healthy choices can help lower blood pressure. If your blood pressure does not get lower with healthy choices, you may need to take medicine. This information is not intended to replace advice given to you by your health care provider. Make sure you discuss any questions you have with your health care provider. Document Revised: 01/16/2021 Document Reviewed: 01/16/2021 Elsevier Patient Education  2024 [employer] Inc.  

## 2022-12-05 ENCOUNTER — Other Ambulatory Visit: Payer: Self-pay | Admitting: Internal Medicine

## 2022-12-07 NOTE — Telephone Encounter (Signed)
Requested Prescriptions  Pending Prescriptions Disp Refills   buPROPion (WELLBUTRIN XL) 150 MG 24 hr tablet [Pharmacy Med Name: BUPROPION HCL XL 150 MG TABLET] 90 tablet 0    Sig: TAKE 1 TABLET BY MOUTH EVERY DAY IN THE MORNING     Psychiatry: Antidepressants - bupropion Passed - 12/05/2022  9:05 AM      Passed - Cr in normal range and within 360 days    Creat  Date Value Ref Range Status  11/02/2022 1.23 0.60 - 1.29 mg/dL Final         Passed - AST in normal range and within 360 days    AST  Date Value Ref Range Status  11/02/2022 32 10 - 40 U/L Final         Passed - ALT in normal range and within 360 days    ALT  Date Value Ref Range Status  11/02/2022 38 9 - 46 U/L Final         Passed - Completed PHQ-2 or PHQ-9 in the last 360 days      Passed - Last BP in normal range    BP Readings from Last 1 Encounters:  12/01/22 132/86         Passed - Valid encounter within last 6 months    Recent Outpatient Visits           6 days ago Primary hypertension   [place] [facility] [place], Salvadore Oxford, NP   1 month ago Encounter for general adult medical examination with abnormal findings   [place] [facility] [place], Salvadore Oxford, NP   7 months ago Primary male hypogonadism   [place] [facility] [place], Salvadore Oxford, NP   1 year ago Encounter for general adult medical examination with abnormal findings   [place] [facility] [place], Salvadore Oxford, NP       Future Appointments             In 4 months Baity, Salvadore Oxford, NP [place] [facility], [facility]

## 2023-01-15 LAB — COLOGUARD: COLOGUARD: NEGATIVE

## 2023-02-01 ENCOUNTER — Other Ambulatory Visit: Payer: Self-pay | Admitting: Internal Medicine

## 2023-02-01 DIAGNOSIS — E291 Testicular hypofunction: Secondary | ICD-10-CM

## 2023-02-02 NOTE — Telephone Encounter (Signed)
Requested medications are due for refill today.  yes  Requested medications are on the active medications list.  yes  Last refill. Testosterone 12/01/2022 5mL 0 rf, Zolpidem 11/09/2022 #30 0 rf  Future visit scheduled.   yes  Notes to clinic.  Refills not delegated.    Requested Prescriptions  Pending Prescriptions Disp Refills   testosterone enanthate (DELATESTRYL) 200 MG/ML injection [Pharmacy Med Name: TESTOSTERON ENAN 1,000 MG/5 ML] 5 mL 0    Sig: INJECT 0.5 MLS (100 MG TOTAL) INTO THE MUSCLE ONCE A WEEK. FOR INTRAMUSCULAR USE ONLY     Off-Protocol Failed - 02/01/2023 11:33 AM      Failed - Medication not assigned to a protocol, review manually.      Passed - Valid encounter within last 12 months    Recent Outpatient Visits           2 months ago Primary hypertension   [place] [facility] [place], Salvadore Oxford, NP   3 months ago Encounter for general adult medical examination with abnormal findings   [place], Salvadore Oxford, NP   9 months ago Primary male hypogonadism   [facility] [facility] [place], Salvadore Oxford, NP   1 year ago Encounter for general adult medical examination with abnormal findings   [place] [place], Salvadore Oxford, NP       Future Appointments             In 3 months Baity, Salvadore Oxford, NP [place] [facility]             zolpidem (AMBIEN) 10 MG tablet [Pharmacy Med Name: ZOLPIDEM TARTRATE 10 MG TABLET] 30 tablet 0    Sig: TAKE 1 TABLET BY MOUTH EVERY DAY AT BEDTIME AS NEEDED FOR SLEEP     Not Delegated - Psychiatry:  Anxiolytics/Hypnotics Failed - 02/01/2023 11:33 AM      Failed - This refill cannot be delegated      Failed - Urine Drug Screen completed in last 360 days      Passed - Valid encounter within last 6 months    Recent Outpatient Visits           2 months ago Primary hypertension   [place]  [place] [place], Salvadore Oxford, NP   3 months ago Encounter for general adult medical examination with abnormal findings   [facility] [place], Salvadore Oxford, NP   9 months ago Primary male hypogonadism    [facility] [place], Salvadore Oxford, NP   1 year ago Encounter for general adult medical examination with abnormal findings    [facility] [address], Salvadore Oxford, NP       Future Appointments             In 3 months Baity, Salvadore Oxford, NP  [facility]            Signed Prescriptions Disp Refills   buPROPion (WELLBUTRIN XL) 150 MG 24 hr tablet 90 tablet 0    Sig: TAKE 1 TABLET BY MOUTH EVERY DAY IN THE MORNING     Psychiatry: Antidepressants - bupropion Passed - 02/01/2023 11:33 AM      Passed - Cr in normal range and within 360 days    Creat  Date Value Ref Range Status  11/02/2022 1.23 0.60 - 1.29  mg/dL Final         Passed - AST in normal range and within 360 days    AST  Date Value Ref Range Status  11/02/2022 32 10 - 40 U/L Final         Passed - ALT in normal range and within 360 days    ALT  Date Value Ref Range Status  11/02/2022 38 9 - 46 U/L Final         Passed - Completed PHQ-2 or PHQ-9 in the last 360 days      Passed - Last BP in normal range    BP Readings from Last 1 Encounters:  12/01/22 132/86         Passed - Valid encounter within last 6 months    Recent Outpatient Visits           2 months ago Primary hypertension   [place] [facility] [place], Salvadore Oxford, NP   3 months ago Encounter for general adult medical examination with abnormal findings   [facility] [place], Salvadore Oxford, NP   9 months ago Primary male hypogonadism   [facility] [place], Salvadore Oxford, NP   1 year ago Encounter for general adult medical examination with abnormal findings   [facility] [place], Salvadore Oxford,  NP       Future Appointments             In 3 months Baity, Salvadore Oxford, NP [place] [facility], [facility]

## 2023-02-02 NOTE — Telephone Encounter (Signed)
Requested Prescriptions  Pending Prescriptions Disp Refills   buPROPion (WELLBUTRIN XL) 150 MG 24 hr tablet [Pharmacy Med Name: BUPROPION HCL XL 150 MG TABLET] 90 tablet 0    Sig: TAKE 1 TABLET BY MOUTH EVERY DAY IN THE MORNING     Psychiatry: Antidepressants - bupropion Passed - 02/01/2023 11:33 AM      Passed - Cr in normal range and within 360 days    Creat  Date Value Ref Range Status  11/02/2022 1.23 0.60 - 1.29 mg/dL Final         Passed - AST in normal range and within 360 days    AST  Date Value Ref Range Status  11/02/2022 32 10 - 40 U/L Final         Passed - ALT in normal range and within 360 days    ALT  Date Value Ref Range Status  11/02/2022 38 9 - 46 U/L Final         Passed - Completed PHQ-2 or PHQ-9 in the last 360 days      Passed - Last BP in normal range    BP Readings from Last 1 Encounters:  12/01/22 132/86         Passed - Valid encounter within last 6 months    Recent Outpatient Visits           2 months ago Primary hypertension   [facility], Salvadore Oxford, NP   3 months ago Encounter for general adult medical examination with abnormal findings   [place] [facility] [place], Salvadore Oxford, NP   9 months ago Primary male hypogonadism   [facility] [place], Salvadore Oxford, NP   1 year ago Encounter for general adult medical examination with abnormal findings   [place] [place], Salvadore Oxford, NP       Future Appointments             In 3 months Baity, Salvadore Oxford, NP [place] [facility]             testosterone enanthate (DELATESTRYL) 200 MG/ML injection [Pharmacy Med Name: TESTOSTERON ENAN 1,000 MG/5 ML] 5 mL 0    Sig: INJECT 0.5 MLS (100 MG TOTAL) INTO THE MUSCLE ONCE A WEEK. FOR INTRAMUSCULAR USE ONLY     Off-Protocol Failed - 02/01/2023 11:33 AM      Failed - Medication not assigned to a protocol, review manually.       Passed - Valid encounter within last 12 months    Recent Outpatient Visits           2 months ago Primary hypertension   [place] [facility] [place], Salvadore Oxford, NP   3 months ago Encounter for general adult medical examination with abnormal findings   [place] [place], Salvadore Oxford, NP   9 months ago Primary male hypogonadism   [place] [facility] [place], Salvadore Oxford, NP   1 year ago Encounter for general adult medical examination with abnormal findings   [place] [facility] [place], Salvadore Oxford, NP       Future Appointments             In 3 months Baity, Salvadore Oxford, NP [facility]             zolpidem [facility])  10 MG tablet [Pharmacy Med Name: ZOLPIDEM TARTRATE 10 MG TABLET] 30 tablet 0    Sig: TAKE 1 TABLET BY MOUTH EVERY DAY AT BEDTIME AS NEEDED FOR SLEEP     Not Delegated - Psychiatry:  Anxiolytics/Hypnotics Failed - 02/01/2023 11:33 AM      Failed - This refill cannot be delegated      Failed - Urine Drug Screen completed in last 360 days      Passed - Valid encounter within last 6 months    Recent Outpatient Visits           2 months ago Primary hypertension   [facility] [place], Salvadore Oxford, NP   3 months ago Encounter for general adult medical examination with abnormal findings   [facility] [place], Salvadore Oxford, NP   9 months ago Primary male hypogonadism   [place] [facility] [place], Salvadore Oxford, NP   1 year ago Encounter for general adult medical examination with abnormal findings   [place] [facility] [place], Salvadore Oxford, NP       Future Appointments             In 3 months Baity, Salvadore Oxford, NP [place] [facility], [facility]

## 2023-02-03 NOTE — Progress Notes (Unsigned)
(  Key: [id number]) PA Case ID #: [id number] Rx #: [id number] Need Help? Call us at [phone] Status sent iconSent to Plan today Drug Testosterone Enanthate 200MG /ML solution ePA cloud Optician, dispensing PA Form [id number] NCPDP) Original Claim Info 75 PRIOR AUTH REQ-MD CALL [phone].DRUG REQUIRES PRIOR AUTHORIZATION(PHARMACY HELP DESK [id number])

## 2023-02-04 NOTE — Progress Notes (Signed)
Prior auth denied.  Patient pays out of pocket.

## 2023-04-19 ENCOUNTER — Other Ambulatory Visit: Payer: Self-pay | Admitting: Internal Medicine

## 2023-04-19 DIAGNOSIS — E291 Testicular hypofunction: Secondary | ICD-10-CM

## 2023-04-21 NOTE — Telephone Encounter (Signed)
 Requested medication (s) are due for refill today - yes  Requested medication (s) are on the active medication list -yes  Future visit scheduled -yes  Last refill: testosterone - 02/03/23 5ml                  Zolpidem - 02/03/23 #30  Notes to clinic: non delegated Rx  Requested Prescriptions  Pending Prescriptions Disp Refills   testosterone  enanthate (DELATESTRYL ) 200 MG/ML injection [Pharmacy Med Name: TESTOSTERON ENAN 1,000 MG/5 ML] 5 mL 0    Sig: INJECT 0.5 MLS (100 MG TOTAL) INTO THE MUSCLE ONCE A WEEK. FOR INTRAMUSCULAR USE ONLY     Off-Protocol Failed - 04/21/2023  3:52 PM      Failed - Medication not assigned to a protocol, review manually.      Passed - Valid encounter within last 12 months    Recent Outpatient Visits           4 months ago Primary hypertension   [facility] [place], Angeline ORN, NP   5 months ago Encounter for general adult medical examination with abnormal findings   [place] [facility], Angeline ORN, NP   11 months ago Primary male hypogonadism   [facility] [place], Angeline ORN, NP   1 year ago Encounter for general adult medical examination with abnormal findings   [place] [facility] [place], Angeline ORN, NP       Future Appointments             In 1 week Antonette, Angeline ORN, NP [facility]             zolpidem  (AMBIEN ) 10 MG tablet [Pharmacy Med Name: ZOLPIDEM  TARTRATE 10 MG TABLET] 30 tablet 0    Sig: TAKE 1 TABLET BY MOUTH AT BEDTIME AS NEEDED FOR SLEEP     Not Delegated - Psychiatry:  Anxiolytics/Hypnotics Failed - 04/21/2023  3:52 PM      Failed - This refill cannot be delegated      Failed - Urine Drug Screen completed in last 360 days      Passed - Valid encounter within last 6 months    Recent Outpatient Visits           4 months ago Primary hypertension   [place] [facility] [place], Angeline ORN, NP    5 months ago Encounter for general adult medical examination with abnormal findings   [place] [facility] [place], Angeline ORN, NP   11 months ago Primary male hypogonadism   [place] [facility] [place], Angeline ORN, NP   1 year ago Encounter for general adult medical examination with abnormal findings   [place] [facility] [place], Angeline ORN, NP       Future Appointments             In 1 week Antonette, Angeline ORN, NP [place], [place]               Requested Prescriptions  Pending Prescriptions Disp Refills   testosterone  enanthate (DELATESTRYL ) 200 MG/ML injection [Pharmacy Med Name: TESTOSTERON ENAN 1,000 MG/5 ML] 5 mL 0    Sig: INJECT 0.5 MLS (100 MG TOTAL) INTO THE MUSCLE ONCE A WEEK. FOR INTRAMUSCULAR USE ONLY     Off-Protocol Failed - 04/21/2023  3:52 PM      Failed -  Medication not assigned to a protocol, review manually.      Passed - Valid encounter within last 12 months    Recent Outpatient Visits           4 months ago Primary hypertension   [facility] [place], Angeline ORN, NP   5 months ago Encounter for general adult medical examination with abnormal findings   [place] [facility], Angeline ORN, NP   11 months ago Primary male hypogonadism   [place] [facility] [place] [place], Angeline ORN, NP   1 year ago Encounter for general adult medical examination with abnormal findings   [facility] [place], Angeline ORN, NP       Future Appointments             In 1 week Antonette, Angeline ORN, NP [facility]             zolpidem  (AMBIEN ) 10 MG tablet [Pharmacy Med Name: ZOLPIDEM  TARTRATE 10 MG TABLET] 30 tablet 0    Sig: TAKE 1 TABLET BY MOUTH AT BEDTIME AS NEEDED FOR SLEEP     Not Delegated - Psychiatry:  Anxiolytics/Hypnotics Failed - 04/21/2023  3:52 PM      Failed - This refill  cannot be delegated      Failed - Urine Drug Screen completed in last 360 days      Passed - Valid encounter within last 6 months    Recent Outpatient Visits           4 months ago Primary hypertension   [place] [facility] [place], Angeline ORN, NP   5 months ago Encounter for general adult medical examination with abnormal findings   [place] [facility] [place], Angeline ORN, NP   11 months ago Primary male hypogonadism   [facility] [place], Angeline ORN, NP   1 year ago Encounter for general adult medical examination with abnormal findings   [place] [facility] [place], Angeline ORN, NP       Future Appointments             In 1 week Antonette, Angeline ORN, NP [place] [facility], [facility]

## 2023-05-03 ENCOUNTER — Ambulatory Visit: Payer: No Typology Code available for payment source | Admitting: Internal Medicine

## 2023-05-25 ENCOUNTER — Ambulatory Visit: Payer: Self-pay | Admitting: Internal Medicine

## 2023-05-28 ENCOUNTER — Other Ambulatory Visit: Payer: Self-pay | Admitting: Internal Medicine

## 2023-05-28 NOTE — Telephone Encounter (Signed)
Requested medication (s) are due for refill today: yes  Requested medication (s) are on the active medication list: yes  Last refill:  12/01/22 #90 1 RF  Future visit scheduled: yes  Notes to clinic:  overdue labs   Requested Prescriptions  Pending Prescriptions Disp Refills   Olmesartan-amLODIPine-HCTZ 20-5-12.5 MG TABS [Pharmacy Med Name: OLMSRTN-AMLDPN-HCTZ 20-5-12.5] 90 tablet 1    Sig: TAKE 1 TABLET BY MOUTH EVERY DAY     Cardiovascular: CCB + ARB + Diuretic Combos Failed - 05/28/2023  3:19 PM      Failed - K in normal range and within 180 days    Potassium  Date Value Ref Range Status  11/02/2022 3.9 3.5 - 5.3 mmol/L Final         Failed - Na in normal range and within 180 days    Sodium  Date Value Ref Range Status  11/02/2022 143 135 - 146 mmol/L Final         Failed - Cr in normal range and within 180 days    Creat  Date Value Ref Range Status  11/02/2022 1.23 0.60 - 1.29 mg/dL Final         Failed - eGFR is 10 or above and within 180 days    GFR  Date Value Ref Range Status  07/09/2020 70.68 >60.00 mL/min Final    Comment:    Calculated using the CKD-EPI Creatinine Equation (2021)   eGFR  Date Value Ref Range Status  11/02/2022 72 > OR = 60 mL/min/1.27m2 Final         Passed - Patient is not pregnant      Passed - Last BP in normal range    BP Readings from Last 1 Encounters:  12/01/22 132/86         Passed - Last Heart Rate in normal range    Pulse Readings from Last 1 Encounters:  12/01/22 98         Passed - Valid encounter within last 6 months    Recent Outpatient Visits           5 months ago Primary hypertension   [facility] [place], Salvadore Oxford, NP   6 months ago Encounter for general adult medical examination with abnormal findings   [facility] [place], Salvadore Oxford, NP   1 year ago Primary male hypogonadism   [place] [facility] [place], Salvadore Oxford, NP   1 year ago  Encounter for general adult medical examination with abnormal findings   [facility] [facility] [place], Salvadore Oxford, NP       Future Appointments             In 6 days Baity, Salvadore Oxford, NP [place] [facility], [facility]

## 2023-06-03 ENCOUNTER — Ambulatory Visit: Payer: No Typology Code available for payment source | Admitting: Internal Medicine

## 2023-06-03 ENCOUNTER — Encounter: Payer: Self-pay | Admitting: Internal Medicine

## 2023-06-03 VITALS — BP 132/80 | Ht 68.0 in | Wt 191.4 lb

## 2023-06-03 DIAGNOSIS — R7303 Prediabetes: Secondary | ICD-10-CM | POA: Diagnosis not present

## 2023-06-03 DIAGNOSIS — E663 Overweight: Secondary | ICD-10-CM

## 2023-06-03 DIAGNOSIS — E781 Pure hyperglyceridemia: Secondary | ICD-10-CM

## 2023-06-03 DIAGNOSIS — E291 Testicular hypofunction: Secondary | ICD-10-CM | POA: Diagnosis not present

## 2023-06-03 DIAGNOSIS — F5102 Adjustment insomnia: Secondary | ICD-10-CM

## 2023-06-03 DIAGNOSIS — I10 Essential (primary) hypertension: Secondary | ICD-10-CM | POA: Diagnosis not present

## 2023-06-03 DIAGNOSIS — F32A Depression, unspecified: Secondary | ICD-10-CM

## 2023-06-03 DIAGNOSIS — Z6829 Body mass index (BMI) 29.0-29.9, adult: Secondary | ICD-10-CM

## 2023-06-03 DIAGNOSIS — F419 Anxiety disorder, unspecified: Secondary | ICD-10-CM

## 2023-06-03 MED ORDER — TESTOSTERONE ENANTHATE 200 MG/ML IM SOLN: 100.0000 mg | INTRAMUSCULAR | 0 refills | Status: DC

## 2023-06-03 MED ORDER — OLMESARTAN-AMLODIPINE-HCTZ 20-5-12.5 MG PO TABS: 1.0000 | ORAL_TABLET | Freq: Every day | ORAL | 0 refills | Status: DC

## 2023-06-03 MED ORDER — ZOLPIDEM TARTRATE 10 MG PO TABS
10.0000 mg | ORAL_TABLET | Freq: Every evening | ORAL | 0 refills | Status: DC | PRN
Start: 2023-06-03 — End: 2023-08-25

## 2023-06-03 MED ORDER — BUPROPION HCL ER (XL) 150 MG PO TB24: 150.0000 mg | ORAL_TABLET | Freq: Every day | ORAL | 1 refills | Status: DC

## 2023-06-03 NOTE — Progress Notes (Signed)
 Subjective:    Patient ID: Patrick Robertson, male    DOB: 1976/02/12, 48 y.o.   MRN: [id number]  HPI  Patient presents to clinic today for follow-up of chronic conditions.  HTN: His BP today is 132/80.  He is taking amlodipine-olmesartan as prescribed.  There is no ECG on file.  Hypotestosteronism: Managed with testosterone injections.  He does not follow with urology.  Insomnia: He has difficulty falling asleep.  He takes [place] as needed with good relief of symptoms.  There is no sleep study on file.  Anxiety and depression: Chronic, managed on bupropion.  He is not currently seeing a therapist.  He denies SI/HI.  HLD: His last LDL was not calculated, triglycerides 623, 10/2022.  He does not want to take statin therapy.  He does not consume a low-fat diet.  Prediabetes: His last A1C was 5.9%, 10/2022. He is not taking any oral diabetic medication at this time. He does not check his sugars.  Review of Systems     Past Medical History:  Diagnosis Date   Basal cell carcinoma (BCC) of chin    Hypertension     Current Outpatient Medications  Medication Sig Dispense Refill   buPROPion (WELLBUTRIN XL) 150 MG 24 hr tablet TAKE 1 TABLET BY MOUTH EVERY DAY IN THE MORNING 90 tablet 0   Multiple Vitamin (MULTIVITAMIN) tablet Take 1 tablet by mouth daily.     Olmesartan-amLODIPine-HCTZ 20-5-12.5 MG TABS TAKE 1 TABLET BY MOUTH EVERY DAY 90 tablet 0   testosterone enanthate (DELATESTRYL) 200 MG/ML injection INJECT 0.5 MLS (100 MG TOTAL) INTO THE MUSCLE ONCE A WEEK. FOR INTRAMUSCULAR USE ONLY 5 mL 0   zolpidem (AMBIEN) 10 MG tablet TAKE 1 TABLET BY MOUTH AT BEDTIME AS NEEDED FOR SLEEP 30 tablet 0   No current facility-administered medications for this visit.    No Known Allergies  Family History  Problem Relation Age of Onset   Hypertension Mother    Hypertension Father    Hypertension Maternal Grandmother    Hypertension Maternal Grandfather    Cancer Maternal Grandfather     Hypertension Paternal Grandmother    Hypertension Paternal Grandfather    Cancer Paternal Grandfather    Hypertension Brother     Social History   Socioeconomic History   Marital status: Married    Spouse name: Not on file   Number of children: Not on file   Years of education: Not on file   Highest education level: Not on file  Occupational History   Not on file  Tobacco Use   Smoking status: Never   Smokeless tobacco: Never  Vaping Use   Vaping status: Never Used  Substance and Sexual Activity   Alcohol use: No    Alcohol/week: 1.0 standard drink of alcohol    Types: 1 Cans of beer per week   Drug use: No   Sexual activity: Yes  Other Topics Concern   Not on file  Social History Narrative   Not on file   Social Drivers of Health   Financial Resource Strain: Not on file  Food Insecurity: Not on file  Transportation Needs: Not on file  Physical Activity: Not on file  Stress: Not on file  Social Connections: Not on file  Intimate Partner Violence: Not on file     Constitutional: Denies fever, malaise, fatigue, headache or abrupt weight changes.  HEENT: Denies eye pain, eye redness, ear pain, ringing in the ears, wax buildup, runny nose, nasal congestion, bloody  nose, or sore throat. Respiratory: Patient reports lingering cough.  Denies difficulty breathing, shortness of breath, or sputum production.   Cardiovascular: Denies chest pain, chest tightness, palpitations or swelling in the hands or feet.  Gastrointestinal: Denies abdominal pain, bloating, constipation, diarrhea or blood in the stool.  GU: Denies urgency, frequency, pain with urination, burning sensation, blood in urine, odor or discharge. Musculoskeletal: Denies decrease in range of motion, difficulty with gait, muscle pain or joint pain and swelling.  Skin: Denies redness, rashes, lesions or ulcercations.  Neurological: Patient reports insomnia.  Denies dizziness, difficulty with memory, difficulty with  speech or problems with balance and coordination.  Psych: Patient has a history of anxiety and depression.  Denies SI/HI.  No other specific complaints in a complete review of systems (except as listed in HPI above).  Objective:   Physical Exam  BP 132/80 (BP Location: Left Arm, Patient Position: Sitting, Cuff Size: Normal)   Ht 5\' 8"  (1.727 m)   Wt 191 lb 6.4 oz (86.8 kg)   BMI 29.10 kg/m    Wt Readings from Last 3 Encounters:  12/01/22 187 lb (84.8 kg)  11/02/22 189 lb (85.7 kg)  04/30/22 186 lb (84.4 kg)    General: Appears his stated age, overweight in NAD. Skin: Warm, dry and intact.  HEENT: Head: normal shape and size; Eyes: sclera white, no icterus, conjunctiva pink, PERRLA and EOMs intact;  Cardiovascular: Normal rate and rhythm. S1,S2 noted.  No murmur, rubs or gallops noted. No JVD or BLE edema.  Pulmonary/Chest: Normal effort and positive vesicular breath sounds. No respiratory distress. No wheezes, rales or ronchi noted.  Musculoskeletal: No difficulty with gait.  Neurological: Alert and oriented. Coordination normal.  Psychiatric: Mood and affect normal. Behavior is normal. Judgment and thought content normal.     BMET    Component Value Date/Time   NA 143 11/02/2022 1509   K 3.9 11/02/2022 1509   CL 101 11/02/2022 1509   CO2 29 11/02/2022 1509   GLUCOSE 89 11/02/2022 1509   BUN 19 11/02/2022 1509   CREATININE 1.23 11/02/2022 1509   CALCIUM 10.3 11/02/2022 1509    Lipid Panel     Component Value Date/Time   CHOL 136 11/02/2022 1509   TRIG 623 (H) 11/02/2022 1509   HDL 18 (L) 11/02/2022 1509   CHOLHDL 7.6 (H) 11/02/2022 1509   VLDL 55.2 (H) 07/09/2020 0913   LDLCALC  11/02/2022 1509     Comment:     . LDL cholesterol not calculated. Triglyceride levels greater than 400 mg/dL invalidate calculated LDL results. . Reference range: <100 . Desirable range <100 mg/dL for primary prevention;   <70 mg/dL for patients with CHD or diabetic patients   with > or = 2 CHD risk factors. Marland Kitchen LDL-C is now calculated using the Martin-Hopkins  calculation, which is a validated novel method providing  better accuracy than the Friedewald equation in the  estimation of LDL-C.  Horald Pollen et al. Lenox Ahr. 2440;102(72): 2061-2068  (http://education.[facility].com/faq/FAQ164)     CBC    Component Value Date/Time   WBC 8.2 11/02/2022 1509   RBC 5.82 (H) 11/02/2022 1509   HGB 17.7 (H) 11/02/2022 1509   HCT 50.7 (H) 11/02/2022 1509   PLT 279 11/02/2022 1509   MCV 87.1 11/02/2022 1509   MCH 30.4 11/02/2022 1509   MCHC 34.9 11/02/2022 1509   RDW 13.4 11/02/2022 1509   LYMPHSABS 3.0 12/10/2016 1434   MONOABS 0.5 12/10/2016 1434   EOSABS 0.1 12/10/2016 1434  BASOSABS 0.1 12/10/2016 1434    Hgb A1C Lab Results  Component Value Date   HGBA1C 5.9 (H) 11/02/2022           Assessment & Plan:      RTC in 6 months for your annual exam Nicki Reaper, NP

## 2023-06-03 NOTE — Progress Notes (Deleted)
 Subjective:    Patient ID: Patrick Robertson, male    DOB: 17-Jan-1976, 48 y.o.   MRN: [id number]  HPI  Patient presents to clinic today for follow-up of chronic conditions.  HTN: His BP today is 135/85.  He is taking amlodipine-olmesartan as prescribed.  There is no ECG on file.  Hypotestosteronism: Managed with testosterone injections.  He does not follow with urology.  Insomnia: He has difficulty falling asleep.  He takes [place] as needed with good relief of symptoms.  There is no sleep study on file.  Anxiety and depression: Chronic, managed on bupropion.  He is not currently seeing a therapist.  He denies SI/HI.  HLD: His last LDL was not calculated, triglycerides 623, 10/2022.  He is not taking fenofibrate as prescribed.  He does not want to take statin therapy.  He does not consume a low-fat diet.  Prediabetes: His last A1C was 5.9%, 10/2022. He is not taking any oral diabetic medication at this time. He does not check his sugars.  Review of Systems     Past Medical History:  Diagnosis Date   Basal cell carcinoma (BCC) of chin    Hypertension     Current Outpatient Medications  Medication Sig Dispense Refill   buPROPion (WELLBUTRIN XL) 150 MG 24 hr tablet TAKE 1 TABLET BY MOUTH EVERY DAY IN THE MORNING 90 tablet 0   Multiple Vitamin (MULTIVITAMIN) tablet Take 1 tablet by mouth daily.     Olmesartan-amLODIPine-HCTZ 20-5-12.5 MG TABS TAKE 1 TABLET BY MOUTH EVERY DAY 90 tablet 0   testosterone enanthate (DELATESTRYL) 200 MG/ML injection INJECT 0.5 MLS (100 MG TOTAL) INTO THE MUSCLE ONCE A WEEK. FOR INTRAMUSCULAR USE ONLY 5 mL 0   zolpidem (AMBIEN) 10 MG tablet TAKE 1 TABLET BY MOUTH AT BEDTIME AS NEEDED FOR SLEEP 30 tablet 0   No current facility-administered medications for this visit.    No Known Allergies  Family History  Problem Relation Age of Onset   Hypertension Mother    Hypertension Father    Hypertension Maternal Grandmother    Hypertension Maternal  Grandfather    Cancer Maternal Grandfather    Hypertension Paternal Grandmother    Hypertension Paternal Grandfather    Cancer Paternal Grandfather    Hypertension Brother     Social History   Socioeconomic History   Marital status: Married    Spouse name: Not on file   Number of children: Not on file   Years of education: Not on file   Highest education level: Not on file  Occupational History   Not on file  Tobacco Use   Smoking status: Never   Smokeless tobacco: Never  Vaping Use   Vaping status: Never Used  Substance and Sexual Activity   Alcohol use: No    Alcohol/week: 1.0 standard drink of alcohol    Types: 1 Cans of beer per week   Drug use: No   Sexual activity: Yes  Other Topics Concern   Not on file  Social History Narrative   Not on file   Social Drivers of Health   Financial Resource Strain: Not on file  Food Insecurity: Not on file  Transportation Needs: Not on file  Physical Activity: Not on file  Stress: Not on file  Social Connections: Not on file  Intimate Partner Violence: Not on file     Constitutional: Denies fever, malaise, fatigue, headache or abrupt weight changes.  HEENT: Denies eye pain, eye redness, ear pain, ringing in the  ears, wax buildup, runny nose, nasal congestion, bloody nose, or sore throat. Respiratory: Patient reports lingering cough.  Denies difficulty breathing, shortness of breath, or sputum production.   Cardiovascular: Denies chest pain, chest tightness, palpitations or swelling in the hands or feet.  Gastrointestinal: Denies abdominal pain, bloating, constipation, diarrhea or blood in the stool.  GU: Denies urgency, frequency, pain with urination, burning sensation, blood in urine, odor or discharge. Musculoskeletal: Denies decrease in range of motion, difficulty with gait, muscle pain or joint pain and swelling.  Skin: Denies redness, rashes, lesions or ulcercations.  Neurological: Patient reports insomnia.  Denies  dizziness, difficulty with memory, difficulty with speech or problems with balance and coordination.  Psych: Patient has a history of anxiety and depression.  Denies SI/HI.  No other specific complaints in a complete review of systems (except as listed in HPI above).  Objective:   Physical Exam  There were no vitals taken for this visit.  Wt Readings from Last 3 Encounters:  12/01/22 187 lb (84.8 kg)  11/02/22 189 lb (85.7 kg)  04/30/22 186 lb (84.4 kg)    General: Appears his stated age, overweight in NAD. Skin: Warm, dry and intact.  HEENT: Head: normal shape and size; Eyes: sclera white, no icterus, conjunctiva pink, PERRLA and EOMs intact;  Cardiovascular: Normal rate and rhythm. S1,S2 noted.  No murmur, rubs or gallops noted. No JVD or BLE edema.  Pulmonary/Chest: Normal effort and positive vesicular breath sounds. No respiratory distress. No wheezes, rales or ronchi noted.  Musculoskeletal: No difficulty with gait.  Neurological: Alert and oriented. Coordination normal.  Psychiatric: Mood and affect normal. Behavior is normal. Judgment and thought content normal.     BMET    Component Value Date/Time   NA 143 11/02/2022 1509   K 3.9 11/02/2022 1509   CL 101 11/02/2022 1509   CO2 29 11/02/2022 1509   GLUCOSE 89 11/02/2022 1509   BUN 19 11/02/2022 1509   CREATININE 1.23 11/02/2022 1509   CALCIUM 10.3 11/02/2022 1509    Lipid Panel     Component Value Date/Time   CHOL 136 11/02/2022 1509   TRIG 623 (H) 11/02/2022 1509   HDL 18 (L) 11/02/2022 1509   CHOLHDL 7.6 (H) 11/02/2022 1509   VLDL 55.2 (H) 07/09/2020 0913   LDLCALC  11/02/2022 1509     Comment:     . LDL cholesterol not calculated. Triglyceride levels greater than 400 mg/dL invalidate calculated LDL results. . Reference range: <100 . Desirable range <100 mg/dL for primary prevention;   <70 mg/dL for patients with CHD or diabetic patients  with > or = 2 CHD risk factors. Marland Kitchen LDL-C is now calculated  using the Martin-Hopkins  calculation, which is a validated novel method providing  better accuracy than the Friedewald equation in the  estimation of LDL-C.  Horald Pollen et al. Lenox Ahr. 1610;960(45): 2061-2068  (http://education.[facility].com/faq/FAQ164)     CBC    Component Value Date/Time   WBC 8.2 11/02/2022 1509   RBC 5.82 (H) 11/02/2022 1509   HGB 17.7 (H) 11/02/2022 1509   HCT 50.7 (H) 11/02/2022 1509   PLT 279 11/02/2022 1509   MCV 87.1 11/02/2022 1509   MCH 30.4 11/02/2022 1509   MCHC 34.9 11/02/2022 1509   RDW 13.4 11/02/2022 1509   LYMPHSABS 3.0 12/10/2016 1434   MONOABS 0.5 12/10/2016 1434   EOSABS 0.1 12/10/2016 1434   BASOSABS 0.1 12/10/2016 1434    Hgb A1C Lab Results  Component Value Date  HGBA1C 5.9 (H) 11/02/2022           Assessment & Plan:      RTC in 6 months for your annual exam Nicki Reaper, NP

## 2023-06-03 NOTE — Assessment & Plan Note (Signed)
Continue Ambien as needed 

## 2023-06-03 NOTE — Patient Instructions (Signed)

## 2023-06-03 NOTE — Assessment & Plan Note (Signed)
C-Met and lipid profile today Encouraged him to consume a low saturated fat diet

## 2023-06-03 NOTE — Assessment & Plan Note (Signed)
Stable on bupropion Support offered

## 2023-06-03 NOTE — Assessment & Plan Note (Signed)
 Encourage diet and exercise for weight loss

## 2023-06-03 NOTE — Assessment & Plan Note (Signed)
Continue testosterone injections °

## 2023-06-03 NOTE — Assessment & Plan Note (Signed)
 Controlled on amlodipine-olmesartan. refilled today Reinforced DASH diet and exercise for weight loss CMET today

## 2023-06-03 NOTE — Assessment & Plan Note (Signed)
A1c today Encouraged low carb diet and exercise for weight loss 

## 2023-06-04 ENCOUNTER — Encounter: Payer: Self-pay | Admitting: Internal Medicine

## 2023-06-04 LAB — LIPID PANEL
Cholesterol: 150 mg/dL (ref <200)
HDL: 22 mg/dL — ABNORMAL LOW (ref >=40)
Non-HDL Cholesterol (Calc): 128 mg/dL (ref <130)
Total CHOL/HDL Ratio: 6.8 (calc) — ABNORMAL HIGH (ref <5.0)
Triglycerides: 507 mg/dL — ABNORMAL HIGH (ref <150)

## 2023-06-04 LAB — COMPLETE METABOLIC PANEL WITH GFR
AG Ratio: 1.8 (calc) (ref 1.0–2.5)
ALT: 41 U/L (ref 9–46)
AST: 27 U/L (ref 10–40)
Albumin: 5 g/dL (ref 3.6–5.1)
Alkaline phosphatase (APISO): 80 U/L (ref 36–130)
BUN: 16 mg/dL (ref 7–25)
CO2: 32 mmol/L (ref 20–32)
Calcium: 9.8 mg/dL (ref 8.6–10.3)
Chloride: 99 mmol/L (ref 98–110)
Creat: 1.05 mg/dL (ref 0.60–1.29)
Globulin: 2.8 g/dL (ref 1.9–3.7)
Glucose, Bld: 74 mg/dL (ref 65–139)
Potassium: 3.9 mmol/L (ref 3.5–5.3)
Sodium: 139 mmol/L (ref 135–146)
Total Bilirubin: 1.3 mg/dL — ABNORMAL HIGH (ref 0.2–1.2)
Total Protein: 7.8 g/dL (ref 6.1–8.1)
eGFR: 88 mL/min/{1.73_m2} (ref >=60)

## 2023-06-04 LAB — CBC
HCT: 52.9 % — ABNORMAL HIGH (ref 38.5–50.0)
Hemoglobin: 18.4 g/dL — ABNORMAL HIGH (ref 13.2–17.1)
MCH: 30.3 pg (ref 27.0–33.0)
MCHC: 34.8 g/dL (ref 32.0–36.0)
MCV: 87.1 fL (ref 80.0–100.0)
MPV: 9.5 fL (ref 7.5–12.5)
Platelets: 267 10*3/uL (ref 140–400)
RBC: 6.07 10*6/uL — ABNORMAL HIGH (ref 4.20–5.80)
RDW: 13.2 % (ref 11.0–15.0)
WBC: 7 10*3/uL (ref 3.8–10.8)

## 2023-06-04 LAB — HEMOGLOBIN A1C
Hgb A1c MFr Bld: 6.2 %{Hb} — ABNORMAL HIGH (ref <5.7)
Mean Plasma Glucose: 131 mg/dL
eAG (mmol/L): 7.3 mmol/L

## 2023-06-18 ENCOUNTER — Telehealth: Payer: Self-pay

## 2023-06-18 NOTE — Telephone Encounter (Signed)
 Mathews Argyle (KeyAlver Sorrow) Rx #: [id number] Need Help? Call us at [phone] Status New (Not sent to plan) Drug Testosterone Enanthate 200MG /ML solution ePA cloud Optician, dispensing PA Form [id number] NCPDP) Original Claim Info 63 PRIOR AUTH REQ-MD CALL [phone].DRUG REQUIRES PRIOR AUTHORIZATION(PHARMACY HELP DESK [phone])

## 2023-06-21 NOTE — Telephone Encounter (Signed)
 You may want to reach out to him about this.  His prior Berkley Harvey has been denied for years.  I believe he pays out-of-pocket.

## 2023-06-21 NOTE — Telephone Encounter (Signed)
 LM for patient to call back to clarify if he was able to pick up his testosterone? Pay out of pocket?

## 2023-08-23 ENCOUNTER — Other Ambulatory Visit: Payer: Self-pay | Admitting: Internal Medicine

## 2023-08-23 DIAGNOSIS — E291 Testicular hypofunction: Secondary | ICD-10-CM

## 2023-08-25 NOTE — Telephone Encounter (Signed)
 Requested medication (s) are due for refill today:   Provider to review both  Requested medication (s) are on the active medication list:   Yes for both  Future visit scheduled:   Yes    Last ordered: Ambien  06/03/2023 #30, 0 refills;   Testosterone  06/03/2023 5 ml, 0 refills  Returned because both are non delegated refills.    Requested Prescriptions  Pending Prescriptions Disp Refills   zolpidem  (AMBIEN ) 10 MG tablet [Pharmacy Med Name: ZOLPIDEM  TARTRATE 10 MG TABLET] 30 tablet 0    Sig: TAKE 1 TABLET BY MOUTH EVERY DAY AT BEDTIME AS NEEDED FOR SLEEP     Not Delegated - Psychiatry:  Anxiolytics/Hypnotics Failed - 08/25/2023 10:34 AM      Failed - This refill cannot be delegated      Failed - Urine Drug Screen completed in last 360 days      Passed - Valid encounter within last 6 months    Recent Outpatient Visits           2 months ago Hypertriglyceridemia   [place] [facility] [place], Rankin Buzzard, NP       Future Appointments             In 3 months [place], Rankin Buzzard, NP [facility]             testosterone  enanthate (DELATESTRYL ) 200 MG/ML injection [Pharmacy Med Name: TESTOSTERON ENAN 1,000 MG/5 ML] 5 mL 0    Sig: Inject 0.5 mLs (100 mg total) into the muscle once a week. FOR INTRAMUSCULAR USE ONLY     Off-Protocol Failed - 08/25/2023 10:34 AM      Failed - Medication not assigned to a protocol, review manually.      Passed - Valid encounter within last 12 months    Recent Outpatient Visits           2 months ago Hypertriglyceridemia   [facility] [place], Rankin Buzzard, NP       Future Appointments             In 3 months [place], Rankin Buzzard, NP  [facility], [facility]

## 2023-08-30 ENCOUNTER — Other Ambulatory Visit (HOSPITAL_COMMUNITY): Payer: Self-pay

## 2023-08-30 ENCOUNTER — Telehealth: Payer: Self-pay

## 2023-08-30 NOTE — Telephone Encounter (Signed)
 Pharmacy Patient Advocate Encounter   Received notification from Onbase that prior authorization for Testosterone  Enanthate 200MG /ML solution  is required/requested.   Insurance verification completed.   The patient is insured through [employer] .   Per test claim: PA required and submitted KEY/EOC/Request #: [id number] from 08/30/2023 to 08/29/2026. Ran test claim, Copay is $68.91. This test claim was processed through [facility]- copay amounts may vary at other pharmacies due to pharmacy/plan contracts, or as the patient moves through the different stages of their insurance plan.

## 2023-10-25 ENCOUNTER — Other Ambulatory Visit: Payer: Self-pay | Admitting: Internal Medicine

## 2023-10-25 DIAGNOSIS — E291 Testicular hypofunction: Secondary | ICD-10-CM

## 2023-10-27 NOTE — Telephone Encounter (Signed)
 Requested medication (s) are due for refill today: yes  Requested medication (s) are on the active medication list: yes  Last refill:  08/25/23  Future visit scheduled: yes  Notes to clinic:  Medication not assigned to a protocol, review manually.      Requested Prescriptions  Pending Prescriptions Disp Refills   testosterone  enanthate (DELATESTRYL ) 200 MG/ML injection [Pharmacy Med Name: TESTOSTERON ENAN 1,000 MG/5 ML] 5 mL 0    Sig: INJECT 0.5 MLS (100 MG TOTAL) INTO THE MUSCLE ONCE A WEEK. FOR INTRAMUSCULAR USE ONLY     Off-Protocol Failed - 10/27/2023  8:08 AM      Failed - Medication not assigned to a protocol, review manually.      Passed - Valid encounter within last 12 months    Recent Outpatient Visits           4 months ago Hypertriglyceridemia   [place] [facility] [place], Angeline ORN, NP       Future Appointments             In 1 month [place], Angeline ORN, NP  [facility], [facility]

## 2023-11-28 ENCOUNTER — Other Ambulatory Visit: Payer: Self-pay | Admitting: Internal Medicine

## 2023-11-30 NOTE — Telephone Encounter (Signed)
 Requested Prescriptions  Pending Prescriptions Disp Refills   Olmesartan -amLODIPine -HCTZ 20-5-12.5 MG TABS [Pharmacy Med Name: OLMSRTN-AMLDPN-HCTZ 20-5-12.5] 90 tablet 0    Sig: TAKE 1 TABLET BY MOUTH EVERY DAY     Cardiovascular: CCB + ARB + Diuretic Combos Passed - 11/30/2023  4:22 PM      Passed - K in normal range and within 180 days    Potassium  Date Value Ref Range Status  06/03/2023 3.9 3.5 - 5.3 mmol/L Final         Passed - Na in normal range and within 180 days    Sodium  Date Value Ref Range Status  06/03/2023 139 135 - 146 mmol/L Final         Passed - Cr in normal range and within 180 days    Creat  Date Value Ref Range Status  06/03/2023 1.05 0.60 - 1.29 mg/dL Final         Passed - eGFR is 10 or above and within 180 days    GFR  Date Value Ref Range Status  07/09/2020 70.68 >60.00 mL/min Final    Comment:    Calculated using the CKD-EPI Creatinine Equation (2021)   eGFR  Date Value Ref Range Status  06/03/2023 88 > OR = 60 mL/min/1.39m2 Final         Passed - Patient is not pregnant      Passed - Last BP in normal range    BP Readings from Last 1 Encounters:  06/03/23 132/80         Passed - Last Heart Rate in normal range    Pulse Readings from Last 1 Encounters:  12/01/22 98         Passed - Valid encounter within last 6 months    Recent Outpatient Visits           6 months ago Hypertriglyceridemia   [facility] [place], Angeline ORN, NP       Future Appointments             In 2 weeks Antonette, Angeline ORN, NP [facility], [facility]

## 2023-12-10 ENCOUNTER — Other Ambulatory Visit: Payer: Self-pay | Admitting: Internal Medicine

## 2023-12-12 NOTE — Telephone Encounter (Signed)
 Requested Prescriptions  Pending Prescriptions Disp Refills   buPROPion  (WELLBUTRIN  XL) 150 MG 24 hr tablet [Pharmacy Med Name: BUPROPION  HCL XL 150 MG TABLET] 90 tablet 0    Sig: TAKE 1 TABLET BY MOUTH EVERY DAY     Psychiatry: Antidepressants - bupropion  Failed - 12/12/2023  9:18 AM      Failed - Completed PHQ-2 or PHQ-9 in the last 360 days      Failed - Valid encounter within last 6 months    Recent Outpatient Visits           6 months ago Hypertriglyceridemia   [facility] [facility] [place], Angeline ORN, NP       Future Appointments             In 4 days [place], Angeline ORN, NP [place] [facility], [address]            Passed - Cr in normal range and within 360 days    Creat  Date Value Ref Range Status  06/03/2023 1.05 0.60 - 1.29 mg/dL Final         Passed - AST in normal range and within 360 days    AST  Date Value Ref Range Status  06/03/2023 27 10 - 40 U/L Final         Passed - ALT in normal range and within 360 days    ALT  Date Value Ref Range Status  06/03/2023 41 9 - 46 U/L Final         Passed - Last BP in normal range    BP Readings from Last 1 Encounters:  06/03/23 132/80

## 2023-12-13 ENCOUNTER — Other Ambulatory Visit: Payer: Self-pay | Admitting: Internal Medicine

## 2023-12-13 DIAGNOSIS — E291 Testicular hypofunction: Secondary | ICD-10-CM

## 2023-12-14 NOTE — Telephone Encounter (Signed)
 Requested medication (s) are due for refill today -yes  Requested medication (s) are on the active medication list -yes  Future visit scheduled -yes  Last refill: zolpidem - 08/25/23 #30- non delegated                   Testosterone - 10/27/23 5ml- off protocol- provider review   Notes to clinic: see above  Requested Prescriptions  Pending Prescriptions Disp Refills   zolpidem  (AMBIEN ) 10 MG tablet [Pharmacy Med Name: ZOLPIDEM  TARTRATE 10 MG TABLET] 15 tablet 1    Sig: TAKE 1 TABLET BY MOUTH AT BEDTIME AS NEEDED FOR SLEEP     Not Delegated - Psychiatry:  Anxiolytics/Hypnotics Failed - 12/14/2023  2:17 PM      Failed - This refill cannot be delegated      Failed - Urine Drug Screen completed in last 360 days      Failed - Valid encounter within last 6 months    Recent Outpatient Visits           6 months ago Hypertriglyceridemia   [place] [facility] [place], Angeline ORN, NP       Future Appointments             In 2 days Antonette, Angeline ORN, NP [place] [facility], [address]             testosterone  enanthate (DELATESTRYL ) 200 MG/ML injection [Pharmacy Med Name: TESTOSTERON ENAN 1,000 MG/5 ML] 5 mL 0    Sig: INJECT 0.5 MLS (100 MG TOTAL) INTO THE MUSCLE ONCE A WEEK. FOR INTRAMUSCULAR USE ONLY     Off-Protocol Failed - 12/14/2023  2:17 PM      Failed - Medication not assigned to a protocol, review manually.      Passed - Valid encounter within last 12 months    Recent Outpatient Visits           6 months ago Hypertriglyceridemia   [place] [facility] [place], Angeline ORN, NP       Future Appointments             In 2 days Antonette, Angeline ORN, NP [place] [facility], [address]               Requested Prescriptions  Pending Prescriptions Disp Refills   zolpidem  (AMBIEN ) 10 MG tablet [Pharmacy Med Name: ZOLPIDEM  TARTRATE 10 MG TABLET] 15 tablet 1    Sig: TAKE 1 TABLET BY MOUTH AT BEDTIME AS NEEDED  FOR SLEEP     Not Delegated - Psychiatry:  Anxiolytics/Hypnotics Failed - 12/14/2023  2:17 PM      Failed - This refill cannot be delegated      Failed - Urine Drug Screen completed in last 360 days      Failed - Valid encounter within last 6 months    Recent Outpatient Visits           6 months ago Hypertriglyceridemia   [facility] [place], Angeline ORN, NP       Future Appointments             In 2 days Antonette, Angeline ORN, NP [place] [facility], [address]             testosterone  enanthate (DELATESTRYL ) 200 MG/ML injection [Pharmacy Med Name: TESTOSTERON ENAN 1,000 MG/5 ML] 5 mL 0    Sig: INJECT 0.5 MLS (100 MG  TOTAL) INTO THE MUSCLE ONCE A WEEK. FOR INTRAMUSCULAR USE ONLY     Off-Protocol Failed - 12/14/2023  2:17 PM      Failed - Medication not assigned to a protocol, review manually.      Passed - Valid encounter within last 12 months    Recent Outpatient Visits           6 months ago Hypertriglyceridemia   [place] [facility] [place], Angeline ORN, NP       Future Appointments             In 2 days [place], Angeline ORN, NP [facility], [address]

## 2023-12-16 ENCOUNTER — Encounter: Payer: Self-pay | Admitting: Internal Medicine

## 2023-12-16 ENCOUNTER — Ambulatory Visit: Payer: No Typology Code available for payment source | Admitting: Internal Medicine

## 2023-12-16 VITALS — BP 132/88 | Ht 68.0 in | Wt 187.2 lb

## 2023-12-16 DIAGNOSIS — E663 Overweight: Secondary | ICD-10-CM

## 2023-12-16 DIAGNOSIS — E781 Pure hyperglyceridemia: Secondary | ICD-10-CM | POA: Diagnosis not present

## 2023-12-16 DIAGNOSIS — Z6828 Body mass index (BMI) 28.0-28.9, adult: Secondary | ICD-10-CM

## 2023-12-16 DIAGNOSIS — Z0001 Encounter for general adult medical examination with abnormal findings: Secondary | ICD-10-CM | POA: Diagnosis not present

## 2023-12-16 DIAGNOSIS — R7303 Prediabetes: Secondary | ICD-10-CM

## 2023-12-16 NOTE — Patient Instructions (Signed)
 Health Maintenance, Male  Adopting a healthy lifestyle and getting preventive care are important in promoting health and wellness. Ask your health care provider about:  The right schedule for you to have regular tests and exams.  Things you can do on your own to prevent diseases and keep yourself healthy.  What should I know about diet, weight, and exercise?  Eat a healthy diet    Eat a diet that includes plenty of vegetables, fruits, low-fat dairy products, and lean protein.  Do not eat a lot of foods that are high in solid fats, added sugars, or sodium.  Maintain a healthy weight  Body mass index (BMI) is a measurement that can be used to identify possible weight problems. It estimates body fat based on height and weight. Your health care provider can help determine your BMI and help you achieve or maintain a healthy weight.  Get regular exercise  Get regular exercise. This is one of the most important things you can do for your health. Most adults should:  Exercise for at least 150 minutes each week. The exercise should increase your heart rate and make you sweat (moderate-intensity exercise).  Do strengthening exercises at least twice a week. This is in addition to the moderate-intensity exercise.  Spend less time sitting. Even light physical activity can be beneficial.  Watch cholesterol and blood lipids  Have your blood tested for lipids and cholesterol at 49 years of age, then have this test every 5 years.  You may need to have your cholesterol levels checked more often if:  Your lipid or cholesterol levels are high.  You are older than 49 years of age.  You are at high risk for heart disease.  What should I know about cancer screening?  Many types of cancers can be detected early and may often be prevented. Depending on your health history and family history, you may need to have cancer screening at various ages. This may include screening for:  Colorectal cancer.  Prostate cancer.  Skin cancer.  Lung  cancer.  What should I know about heart disease, diabetes, and high blood pressure?  Blood pressure and heart disease  High blood pressure causes heart disease and increases the risk of stroke. This is more likely to develop in people who have high blood pressure readings or are overweight.  Talk with your health care provider about your target blood pressure readings.  Have your blood pressure checked:  Every 3-5 years if you are 24-52 years of age.  Every year if you are 3 years old or older.  If you are between the ages of 60 and 72 and are a current or former smoker, ask your health care provider if you should have a one-time screening for abdominal aortic aneurysm (AAA).  Diabetes  Have regular diabetes screenings. This checks your fasting blood sugar level. Have the screening done:  Once every three years after age 66 if you are at a normal weight and have a low risk for diabetes.  More often and at a younger age if you are overweight or have a high risk for diabetes.  What should I know about preventing infection?  Hepatitis B  If you have a higher risk for hepatitis B, you should be screened for this virus. Talk with your health care provider to find out if you are at risk for hepatitis B infection.  Hepatitis C  Blood testing is recommended for:  Everyone born from [birth year] through 1966.  Anyone  with known risk factors for hepatitis C.  Sexually transmitted infections (STIs)  You should be screened each year for STIs, including gonorrhea and chlamydia, if:  You are sexually active and are younger than 49 years of age.  You are older than 49 years of age and your health care provider tells you that you are at risk for this type of infection.  Your sexual activity has changed since you were last screened, and you are at increased risk for chlamydia or gonorrhea. Ask your health care provider if you are at risk.  Ask your health care provider about whether you are at high risk for HIV. Your health care provider  may recommend a prescription medicine to help prevent HIV infection. If you choose to take medicine to prevent HIV, you should first get tested for HIV. You should then be tested every 3 months for as long as you are taking the medicine.  Follow these instructions at home:  Alcohol use  Do not drink alcohol if your health care provider tells you not to drink.  If you drink alcohol:  Limit how much you have to 0-2 drinks a day.  Know how much alcohol is in your drink. In the [place] one drink equals one 12 oz bottle of beer (355 mL), one 5 oz glass of wine (148 mL), or one 1 oz glass of hard liquor (44 mL).  Lifestyle  Do not use any products that contain nicotine or tobacco. These products include cigarettes, chewing tobacco, and vaping devices, such as e-cigarettes. If you need help quitting, ask your health care provider.  Do not use street drugs.  Do not share needles.  Ask your health care provider for help if you need support or information about quitting drugs.  General instructions  Schedule regular health, dental, and eye exams.  Stay current with your vaccines.  Tell your health care provider if:  You often feel depressed.  You have ever been abused or do not feel safe at home.  Summary  Adopting a healthy lifestyle and getting preventive care are important in promoting health and wellness.  Follow your health care provider's instructions about healthy diet, exercising, and getting tested or screened for diseases.  Follow your health care provider's instructions on monitoring your cholesterol and blood pressure.  This information is not intended to replace advice given to you by your health care provider. Make sure you discuss any questions you have with your health care provider.  Document Revised: 08/19/2020 Document Reviewed: 08/19/2020  Elsevier Patient Education  2024 [employer].

## 2023-12-16 NOTE — Progress Notes (Signed)
 Subjective:    Patient ID: Patrick Robertson, male    DOB: 28-Aug-1975, 48 y.o.   MRN: [id number]  HPI  Patient presents to clinic today for his annual exam.  Flu: 01/2022 Tetanus: 06/2020 COVID: X 2 Colon screening: 12/2022, Cologuard Vision screening: annually Dentist: biannually  Diet: He does eat meat. He consumes fruits and veggies. He does eat some fried foods. He drinks mostly water. Exercise: spin class, walking  Review of Systems     Past Medical History:  Diagnosis Date   Basal cell carcinoma (BCC) of chin    Hypertension     Current Outpatient Medications  Medication Sig Dispense Refill   buPROPion  (WELLBUTRIN  XL) 150 MG 24 hr tablet TAKE 1 TABLET BY MOUTH EVERY DAY 90 tablet 0   Multiple Vitamin (MULTIVITAMIN) tablet Take 1 tablet by mouth daily.     Olmesartan -amLODIPine -HCTZ 20-5-12.5 MG TABS TAKE 1 TABLET BY MOUTH EVERY DAY 90 tablet 0   testosterone  enanthate (DELATESTRYL ) 200 MG/ML injection INJECT 0.5 MLS (100 MG TOTAL) INTO THE MUSCLE ONCE A WEEK. FOR INTRAMUSCULAR USE ONLY 5 mL 0   zolpidem  (AMBIEN ) 10 MG tablet TAKE 1 TABLET BY MOUTH AT BEDTIME AS NEEDED FOR SLEEP 15 tablet 0   No current facility-administered medications for this visit.    No Known Allergies  Family History  Problem Relation Age of Onset   Hypertension Mother    Hypertension Father    Hypertension Maternal Grandmother    Hypertension Maternal Grandfather    Cancer Maternal Grandfather    Hypertension Paternal Grandmother    Hypertension Paternal Grandfather    Cancer Paternal Grandfather    Hypertension Brother     Social History   Socioeconomic History   Marital status: Married    Spouse name: Not on file   Number of children: Not on file   Years of education: Not on file   Highest education level: Not on file  Occupational History   Not on file  Tobacco Use   Smoking status: Never   Smokeless tobacco: Never  Vaping Use   Vaping status: Never Used  Substance  and Sexual Activity   Alcohol use: No    Alcohol/week: 1.0 standard drink of alcohol    Types: 1 Cans of beer per week   Drug use: No   Sexual activity: Yes  Other Topics Concern   Not on file  Social History Narrative   Not on file   Social Drivers of Health   Financial Resource Strain: Not on file  Food Insecurity: Not on file  Transportation Needs: Not on file  Physical Activity: Not on file  Stress: Not on file  Social Connections: Not on file  Intimate Partner Violence: Not on file     Constitutional: Denies fever, malaise, fatigue, headache or abrupt weight changes.  HEENT: Denies eye pain, eye redness, ear pain, ringing in the ears, wax buildup, runny nose, nasal congestion, bloody nose, or sore throat. Respiratory: Denies difficulty breathing, shortness of breath, cough or sputum production.   Cardiovascular: Denies chest pain, chest tightness, palpitations or swelling in the hands or feet.  Gastrointestinal: Denies abdominal pain, bloating, constipation, diarrhea or blood in the stool.  GU: Denies urgency, frequency, pain with urination, burning sensation, blood in urine, odor or discharge. Musculoskeletal: Denies decrease in range of motion, difficulty with gait, muscle pain or joint pain and swelling.  Skin: Denies redness, rashes, lesions or ulcercations.  Neurological: Patient reports insomnia.  Denies dizziness, difficulty with  memory, difficulty with speech or problems with balance and coordination.  Psych: Patient has a history of anxiety and depression.  Denies SI/HI.  No other specific complaints in a complete review of systems (except as listed in HPI above).  Objective:   Physical Exam  BP 132/88 Comment: home readings  Ht 5' 8 (1.727 m)   Wt 187 lb 3.2 oz (84.9 kg)   BMI 28.46 kg/m    Wt Readings from Last 3 Encounters:  06/03/23 191 lb 6.4 oz (86.8 kg)  12/01/22 187 lb (84.8 kg)  11/02/22 189 lb (85.7 kg)    General: Appears his stated age,  overweight in NAD. Skin: Warm, dry and intact. 2 cm cyst noted overlying the sternum. HEENT: Head: normal shape and size; Eyes: sclera white, no icterus, conjunctiva pink, PERRLA and EOMs intact;  Neck:  Neck supple, trachea midline. No masses, lumps or thyromegaly present.  Cardiovascular: Normal rate and rhythm. S1,S2 noted.  No murmur, rubs or gallops noted. No JVD or BLE edema. Pulmonary/Chest: Normal effort and positive vesicular breath sounds. No respiratory distress. No wheezes, rales or ronchi noted.  Abdomen: Normal bowel sounds. Musculoskeletal: Strength 5/5 BUE/BLE.  No difficulty with gait.  Neurological: Alert and oriented. Cranial nerves II-XII grossly intact. Coordination normal.  Psychiatric: Mood and affect normal. Behavior is normal. Judgment and thought content normal.     BMET    Component Value Date/Time   NA 139 06/03/2023 1351   K 3.9 06/03/2023 1351   CL 99 06/03/2023 1351   CO2 32 06/03/2023 1351   GLUCOSE 74 06/03/2023 1351   BUN 16 06/03/2023 1351   CREATININE 1.05 06/03/2023 1351   CALCIUM 9.8 06/03/2023 1351    Lipid Panel     Component Value Date/Time   CHOL 150 06/03/2023 1351   TRIG 507 (H) 06/03/2023 1351   HDL 22 (L) 06/03/2023 1351   CHOLHDL 6.8 (H) 06/03/2023 1351   VLDL 55.2 (H) 07/09/2020 0913   LDLCALC  06/03/2023 1351     Comment:     . LDL cholesterol not calculated. Triglyceride levels greater than 400 mg/dL invalidate calculated LDL results. . Reference range: <100 . Desirable range <100 mg/dL for primary prevention;   <70 mg/dL for patients with CHD or diabetic patients  with > or = 2 CHD risk factors. SABRA LDL-C is now calculated using the Martin-Hopkins  calculation, which is a validated novel method providing  better accuracy than the Friedewald equation in the  estimation of LDL-C.  Patrick Robertson et al. Patrick Robertson. 7986;689(80): 2061-2068  (http://education.[employer].com/faq/FAQ164)     CBC    Component Value Date/Time    WBC 7.0 06/03/2023 1351   RBC 6.07 (H) 06/03/2023 1351   HGB 18.4 (H) 06/03/2023 1351   HCT 52.9 (H) 06/03/2023 1351   PLT 267 06/03/2023 1351   MCV 87.1 06/03/2023 1351   MCH 30.3 06/03/2023 1351   MCHC 34.8 06/03/2023 1351   RDW 13.2 06/03/2023 1351   LYMPHSABS 3.0 12/10/2016 1434   MONOABS 0.5 12/10/2016 1434   EOSABS 0.1 12/10/2016 1434   BASOSABS 0.1 12/10/2016 1434    Hgb A1C Lab Results  Component Value Date   HGBA1C 6.2 (H) 06/03/2023           Assessment & Plan:   Preventative health maintenance:  Encouraged him to get flu shot in the fall Tetanus UTD Encouraged him to get his COVID booster Colon screening UTD Encouraged him to consume a balanced diet and exercise regimen Advised  him to see an eye doctor and dentist annually We will check CBC, c-Met, lipid, A1c today  RTC in 6 months, follow-up chronic conditions Angeline Laura, NP

## 2023-12-16 NOTE — Assessment & Plan Note (Signed)
 Encourage diet and exercise for weight loss

## 2023-12-17 ENCOUNTER — Ambulatory Visit: Payer: Self-pay | Admitting: Internal Medicine

## 2023-12-17 LAB — COMPREHENSIVE METABOLIC PANEL WITH GFR
AG Ratio: 2 (calc) (ref 1.0–2.5)
ALT: 34 U/L (ref 9–46)
AST: 27 U/L (ref 10–40)
Albumin: 5 g/dL (ref 3.6–5.1)
Alkaline phosphatase (APISO): 79 U/L (ref 36–130)
BUN: 16 mg/dL (ref 7–25)
CO2: 26 mmol/L (ref 20–32)
Calcium: 9.5 mg/dL (ref 8.6–10.3)
Chloride: 101 mmol/L (ref 98–110)
Creat: 1.11 mg/dL (ref 0.60–1.29)
Globulin: 2.5 g/dL (ref 1.9–3.7)
Glucose, Bld: 109 mg/dL (ref 65–139)
Potassium: 3.8 mmol/L (ref 3.5–5.3)
Sodium: 138 mmol/L (ref 135–146)
Total Bilirubin: 1.5 mg/dL — ABNORMAL HIGH (ref 0.2–1.2)
Total Protein: 7.5 g/dL (ref 6.1–8.1)
eGFR: 81 mL/min/1.73m2 (ref >=60)

## 2023-12-17 LAB — CBC
HCT: 51.3 % — ABNORMAL HIGH (ref 38.5–50.0)
Hemoglobin: 17.6 g/dL — ABNORMAL HIGH (ref 13.2–17.1)
MCH: 29.8 pg (ref 27.0–33.0)
MCHC: 34.3 g/dL (ref 32.0–36.0)
MCV: 86.8 fL (ref 80.0–100.0)
MPV: 9.5 fL (ref 7.5–12.5)
Platelets: 258 Thousand/uL (ref 140–400)
RBC: 5.91 Million/uL — ABNORMAL HIGH (ref 4.20–5.80)
RDW: 13.7 % (ref 11.0–15.0)
WBC: 6.2 Thousand/uL (ref 3.8–10.8)

## 2023-12-17 LAB — LIPID PANEL
Cholesterol: 136 mg/dL (ref <200)
HDL: 21 mg/dL — ABNORMAL LOW (ref >=40)
LDL Cholesterol (Calc): 78 mg/dL
Non-HDL Cholesterol (Calc): 115 mg/dL (ref <130)
Total CHOL/HDL Ratio: 6.5 (calc) — ABNORMAL HIGH (ref <5.0)
Triglycerides: 282 mg/dL — ABNORMAL HIGH (ref <150)

## 2023-12-17 LAB — HEMOGLOBIN A1C
Hgb A1c MFr Bld: 5.7 % — ABNORMAL HIGH (ref <5.7)
Mean Plasma Glucose: 117 mg/dL
eAG (mmol/L): 6.5 mmol/L

## 2024-01-24 ENCOUNTER — Other Ambulatory Visit: Payer: Self-pay | Admitting: Internal Medicine

## 2024-01-24 DIAGNOSIS — E291 Testicular hypofunction: Secondary | ICD-10-CM

## 2024-01-27 NOTE — Telephone Encounter (Signed)
 Requested medication (s) are due for refill today: Yes  Requested medication (s) are on the active medication list: Yes  Last refill:  12/14/23  Future visit scheduled: Yes  Notes to clinic:  Unable to refill per protocol, cannot delegate. Unable to refill due to no refill protocol for this medication.      Requested Prescriptions  Pending Prescriptions Disp Refills   testosterone  enanthate (DELATESTRYL ) 200 MG/ML injection [Pharmacy Med Name: TESTOSTERON ENAN 1,000 MG/5 ML] 5 mL 0    Sig: INJECT 0.5 MLS (100 MG TOTAL) INTO THE MUSCLE ONCE A WEEK. FOR INTRAMUSCULAR USE ONLY     Off-Protocol Failed - 01/27/2024 10:29 AM      Failed - Medication not assigned to a protocol, review manually.      Passed - Valid encounter within last 12 months    Recent Outpatient Visits           1 month ago Encounter for general adult medical examination with abnormal findings   [place] [facility] [place], Angeline ORN, NP   7 months ago Hypertriglyceridemia   [place] [facility] [place], [place] W, NP               zolpidem  (AMBIEN ) 10 MG tablet [Pharmacy Med Name: ZOLPIDEM  TARTRATE 10 MG TABLET] 15 tablet 0    Sig: TAKE 1 TABLET BY MOUTH EVERY DAY AT BEDTIME AS NEEDED FOR SLEEP     Not Delegated - Psychiatry:  Anxiolytics/Hypnotics Failed - 01/27/2024 10:29 AM      Failed - This refill cannot be delegated      Failed - Urine Drug Screen completed in last 360 days      Passed - Valid encounter within last 6 months    Recent Outpatient Visits           1 month ago Encounter for general adult medical examination with abnormal findings   [place] [facility] [place], Angeline ORN, NP   7 months ago Hypertriglyceridemia   [facility] [place], Angeline ORN, [place]

## 2024-01-27 NOTE — Telephone Encounter (Signed)
 Requested medication (s) are due for refill today: Yes  Requested medication (s) are on the active medication list: Yes  Last refill:  12/14/23  Future visit scheduled: Yes  Notes to clinic:  Unable to refill due to no refill protocol for this medication. Unable to refill per protocol, cannot delegate.      Requested Prescriptions  Pending Prescriptions Disp Refills   testosterone  enanthate (DELATESTRYL ) 200 MG/ML injection [Pharmacy Med Name: TESTOSTERON ENAN 1,000 MG/5 ML] 5 mL 0    Sig: INJECT 0.5 MLS (100 MG TOTAL) INTO THE MUSCLE ONCE A WEEK. FOR INTRAMUSCULAR USE ONLY     Off-Protocol Failed - 01/27/2024 10:28 AM      Failed - Medication not assigned to a protocol, review manually.      Passed - Valid encounter within last 12 months    Recent Outpatient Visits           1 month ago Encounter for general adult medical examination with abnormal findings   [place] [facility] [place], Angeline ORN, NP   7 months ago Hypertriglyceridemia   [place] [facility] [place], Angeline ORN, NP               zolpidem  (AMBIEN ) 10 MG tablet [Pharmacy Med Name: ZOLPIDEM  TARTRATE 10 MG TABLET] 15 tablet 0    Sig: TAKE 1 TABLET BY MOUTH EVERY DAY AT BEDTIME AS NEEDED FOR SLEEP     Not Delegated - Psychiatry:  Anxiolytics/Hypnotics Failed - 01/27/2024 10:28 AM      Failed - This refill cannot be delegated      Failed - Urine Drug Screen completed in last 360 days      Passed - Valid encounter within last 6 months    Recent Outpatient Visits           1 month ago Encounter for general adult medical examination with abnormal findings   [place] [facility] [place], Angeline ORN, NP   7 months ago Hypertriglyceridemia   [facility] [place], Angeline ORN, [place]

## 2024-02-24 ENCOUNTER — Other Ambulatory Visit: Payer: Self-pay | Admitting: Internal Medicine

## 2024-02-26 NOTE — Telephone Encounter (Signed)
 Requested medication (s) are due for refill today: yes  Requested medication (s) are on the active medication list: yes  Last refill:  01/27/24  Future visit scheduled: yes  Notes to clinic:  Unable to refill per protocol, cannot delegate.      Requested Prescriptions  Pending Prescriptions Disp Refills   zolpidem  (AMBIEN ) 10 MG tablet [Pharmacy Med Name: ZOLPIDEM  TARTRATE 10 MG TABLET] 15 tablet 0    Sig: TAKE 1 TABLET BY MOUTH AT BEDTIME AS NEEDED FOR SLEEP     Not Delegated - Psychiatry:  Anxiolytics/Hypnotics Failed - 02/26/2024 11:32 AM      Failed - This refill cannot be delegated      Failed - Urine Drug Screen completed in last 360 days      Passed - Valid encounter within last 6 months    Recent Outpatient Visits           2 months ago Encounter for general adult medical examination with abnormal findings   [place] [facility] [place], Angeline ORN, NP   8 months ago Hypertriglyceridemia   [place] [facility] [place], Angeline ORN, NP               Olmesartan -amLODIPine -HCTZ 20-5-12.5 MG TABS [Pharmacy Med Name: OLMSRTN-AMLDPN-HCTZ 20-5-12.5] 90 tablet 0    Sig: TAKE 1 TABLET BY MOUTH EVERY DAY     Cardiovascular: CCB + ARB + Diuretic Combos Passed - 02/26/2024 11:32 AM      Passed - K in normal range and within 180 days    Potassium  Date Value Ref Range Status  12/16/2023 3.8 3.5 - 5.3 mmol/L Final         Passed - Na in normal range and within 180 days    Sodium  Date Value Ref Range Status  12/16/2023 138 135 - 146 mmol/L Final         Passed - Cr in normal range and within 180 days    Creat  Date Value Ref Range Status  12/16/2023 1.11 0.60 - 1.29 mg/dL Final         Passed - eGFR is 10 or above and within 180 days    GFR  Date Value Ref Range Status  07/09/2020 70.68 >60.00 mL/min Final    Comment:    Calculated using the CKD-EPI Creatinine Equation (2021)   eGFR  Date Value Ref Range Status  12/16/2023  81 > OR = 60 mL/min/1.30m2 Final         Passed - Patient is not pregnant      Passed - Last BP in normal range    BP Readings from Last 1 Encounters:  12/16/23 132/88         Passed - Last Heart Rate in normal range    Pulse Readings from Last 1 Encounters:  12/01/22 98         Passed - Valid encounter within last 6 months    Recent Outpatient Visits           2 months ago Encounter for general adult medical examination with abnormal findings   [facility] [place], Angeline ORN, NP   8 months ago Hypertriglyceridemia   [facility] [place], Angeline ORN, [place]

## 2024-03-28 ENCOUNTER — Other Ambulatory Visit: Payer: Self-pay | Admitting: Internal Medicine

## 2024-03-28 DIAGNOSIS — E291 Testicular hypofunction: Secondary | ICD-10-CM

## 2024-03-31 NOTE — Telephone Encounter (Signed)
 Requested medication (s) are due for refill today - yes  Requested medication (s) are on the active medication list -yes  Future visit scheduled -yes  Last refill: testosterone - 01/27/24 5ml- off protocol- provider review                   Zolpidem - 02/28/24 #15- non delegated Rx  Notes to clinic: see above  Requested Prescriptions  Pending Prescriptions Disp Refills   zolpidem  (AMBIEN ) 10 MG tablet [Pharmacy Med Name: ZOLPIDEM  TARTRATE 10 MG TABLET] 15 tablet 0    Sig: TAKE 1 TABLET BY MOUTH EVERY DAY AT BEDTIME AS NEEDED FOR SLEEP     Not Delegated - Psychiatry:  Anxiolytics/Hypnotics Failed - 03/31/2024 10:08 AM      Failed - This refill cannot be delegated      Failed - Urine Drug Screen completed in last 360 days      Passed - Valid encounter within last 6 months    Recent Outpatient Visits           3 months ago Encounter for general adult medical examination with abnormal findings   [place] [facility] [place], Angeline ORN, NP   10 months ago Hypertriglyceridemia   [place] [facility] [place], [place] W, NP               testosterone  enanthate (DELATESTRYL ) 200 MG/ML injection [Pharmacy Med Name: TESTOSTERON ENAN 1,000 MG/5 ML] 5 mL 0    Sig: INJECT 0.5 MLS (100 MG TOTAL) INTO THE MUSCLE ONCE A WEEK. FOR INTRAMUSCULAR USE ONLY     Off-Protocol Failed - 03/31/2024 10:08 AM      Failed - Medication not assigned to a protocol, review manually.      Passed - Valid encounter within last 12 months    Recent Outpatient Visits           3 months ago Encounter for general adult medical examination with abnormal findings   [place] [facility] [place], Angeline ORN, NP   10 months ago Hypertriglyceridemia   [place] [facility] [place], Angeline ORN, NP                 Requested Prescriptions  Pending Prescriptions Disp Refills   zolpidem  (AMBIEN ) 10 MG tablet [Pharmacy Med Name: ZOLPIDEM  TARTRATE  10 MG TABLET] 15 tablet 0    Sig: TAKE 1 TABLET BY MOUTH EVERY DAY AT BEDTIME AS NEEDED FOR SLEEP     Not Delegated - Psychiatry:  Anxiolytics/Hypnotics Failed - 03/31/2024 10:08 AM      Failed - This refill cannot be delegated      Failed - Urine Drug Screen completed in last 360 days      Passed - Valid encounter within last 6 months    Recent Outpatient Visits           3 months ago Encounter for general adult medical examination with abnormal findings   [place] [facility] [place], Angeline ORN, NP   10 months ago Hypertriglyceridemia   [place] [facility] [place], [place] W, NP               testosterone  enanthate (DELATESTRYL ) 200 MG/ML injection [Pharmacy Med Name: TESTOSTERON ENAN 1,000 MG/5 ML] 5 mL 0    Sig: INJECT 0.5 MLS (100 MG TOTAL) INTO THE MUSCLE ONCE A WEEK. FOR INTRAMUSCULAR USE ONLY     Off-Protocol Failed - 03/31/2024  10:08 AM      Failed - Medication not assigned to a protocol, review manually.      Passed - Valid encounter within last 12 months    Recent Outpatient Visits           3 months ago Encounter for general adult medical examination with abnormal findings   [place] [facility] [place], Angeline ORN, NP   10 months ago Hypertriglyceridemia   [place] [facility] [place], Angeline ORN, [place]

## 2024-04-23 ENCOUNTER — Other Ambulatory Visit: Payer: Self-pay | Admitting: Internal Medicine

## 2024-04-24 NOTE — Telephone Encounter (Signed)
 Requested Prescriptions  Pending Prescriptions Disp Refills   buPROPion  (WELLBUTRIN  XL) 150 MG 24 hr tablet [Pharmacy Med Name: BUPROPION  HCL XL 150 MG TABLET] 90 tablet 0    Sig: TAKE 1 TABLET BY MOUTH EVERY DAY     Psychiatry: Antidepressants - bupropion  Passed - 04/24/2024  5:29 PM      Passed - Cr in normal range and within 360 days    Creat  Date Value Ref Range Status  12/16/2023 1.11 0.60 - 1.29 mg/dL Final         Passed - AST in normal range and within 360 days    AST  Date Value Ref Range Status  12/16/2023 27 10 - 40 U/L Final         Passed - ALT in normal range and within 360 days    ALT  Date Value Ref Range Status  12/16/2023 34 9 - 46 U/L Final         Passed - Completed PHQ-2 or PHQ-9 in the last 360 days      Passed - Last BP in normal range    BP Readings from Last 1 Encounters:  12/16/23 132/88         Passed - Valid encounter within last 6 months    Recent Outpatient Visits           4 months ago Encounter for general adult medical examination with abnormal findings   [place] [facility] [place], Angeline ORN, NP   10 months ago Hypertriglyceridemia   [facility] [place], Angeline ORN, [place]

## 2024-05-02 ENCOUNTER — Other Ambulatory Visit: Payer: Self-pay

## 2024-05-02 MED FILL — Bupropion HCl Tab ER 24HR 150 MG: ORAL | 90 days supply | Qty: 90 | Fill #0 | Status: AC

## 2024-05-03 ENCOUNTER — Other Ambulatory Visit: Payer: Self-pay | Admitting: Internal Medicine

## 2024-05-03 NOTE — Telephone Encounter (Unsigned)
 Copied from CRM #[id number]. Topic: Clinical - Medication Refill >> May 03, 2024  2:31 PM Santiya F wrote: Medication: zolpidem  (AMBIEN ) 10 MG tablet [[id number]], Olmesartan -amLODIPine -HCTZ 20-5-12.5 MG TABS [[id number]]  Has the patient contacted their pharmacy? Yes  (Agent: If yes, when and what did the pharmacy advise?) contact office   This is the patient's preferred pharmacy:  [facility] [address] [place] [address] Phone: [phone] Fax: [phone]  Is this the correct pharmacy for this prescription? Yes If no, delete pharmacy and type the correct one.   Has the prescription been filled recently? Yes  Is the patient out of the medication? No  Has the patient been seen for an appointment in the last year OR does the patient have an upcoming appointment? Yes  Can we respond through MyChart? Yes  Agent: Please be advised that Rx refills may take up to 3 business days. We ask that you follow-up with your pharmacy.  Requesting a 90 day supply.

## 2024-05-04 ENCOUNTER — Other Ambulatory Visit: Payer: Self-pay

## 2024-05-04 ENCOUNTER — Other Ambulatory Visit (HOSPITAL_COMMUNITY): Payer: Self-pay

## 2024-05-04 MED ORDER — OLMESARTAN-AMLODIPINE-HCTZ 20-5-12.5 MG PO TABS
1.0000 | ORAL_TABLET | Freq: Every day | ORAL | 0 refills | Status: AC
  Filled 2024-05-04: qty 90, 90d supply, fill #0

## 2024-05-04 MED ORDER — ZOLPIDEM TARTRATE 10 MG PO TABS
10.0000 mg | ORAL_TABLET | Freq: Every evening | ORAL | 0 refills | Status: AC | PRN
  Filled 2024-05-04: qty 15, 15d supply, fill #0

## 2024-05-04 NOTE — Telephone Encounter (Signed)
 Requested Prescriptions  Pending Prescriptions Disp Refills   zolpidem  (AMBIEN ) 10 MG tablet 15 tablet 0     Not Delegated - Psychiatry:  Anxiolytics/Hypnotics Failed - 05/04/2024  8:25 AM      Failed - This refill cannot be delegated      Failed - Urine Drug Screen completed in last 360 days      Passed - Valid encounter within last 6 months    Recent Outpatient Visits           4 months ago Encounter for general adult medical examination with abnormal findings   [place] [facility] [place], Angeline ORN, NP   11 months ago Hypertriglyceridemia   [place] [facility] [place], [place] W, NP               Olmesartan -amLODIPine -HCTZ 20-5-12.5 MG TABS 90 tablet 0    Sig: Take 1 tablet by mouth daily.     Cardiovascular: CCB + ARB + Diuretic Combos Passed - 05/04/2024  8:25 AM      Passed - K in normal range and within 180 days    Potassium  Date Value Ref Range Status  12/16/2023 3.8 3.5 - 5.3 mmol/L Final         Passed - Na in normal range and within 180 days    Sodium  Date Value Ref Range Status  12/16/2023 138 135 - 146 mmol/L Final         Passed - Cr in normal range and within 180 days    Creat  Date Value Ref Range Status  12/16/2023 1.11 0.60 - 1.29 mg/dL Final         Passed - eGFR is 10 or above and within 180 days    GFR  Date Value Ref Range Status  07/09/2020 70.68 >60.00 mL/min Final    Comment:    Calculated using the CKD-EPI Creatinine Equation (2021)   eGFR  Date Value Ref Range Status  12/16/2023 81 > OR = 60 mL/min/1.79m2 Final         Passed - Patient is not pregnant      Passed - Last BP in normal range    BP Readings from Last 1 Encounters:  12/16/23 132/88         Passed - Last Heart Rate in normal range    Pulse Readings from Last 1 Encounters:  12/01/22 98         Passed - Valid encounter within last 6 months    Recent Outpatient Visits           4 months ago Encounter for general adult  medical examination with abnormal findings   [place] [facility] [place], Angeline ORN, NP   11 months ago Hypertriglyceridemia   [facility] [place], Angeline ORN, [place]

## 2024-05-04 NOTE — Telephone Encounter (Signed)
 Requested medication (s) are due for refill today: yes  Requested medication (s) are on the active medication list: yes  Last refill:  03/31/24  Future visit scheduled: {Yes  Notes to clinic:  Unable to refill per protocol, cannot delegate.      Requested Prescriptions  Pending Prescriptions Disp Refills   zolpidem  (AMBIEN ) 10 MG tablet 15 tablet 0     Not Delegated - Psychiatry:  Anxiolytics/Hypnotics Failed - 05/04/2024  8:28 AM      Failed - This refill cannot be delegated      Failed - Urine Drug Screen completed in last 360 days      Passed - Valid encounter within last 6 months    Recent Outpatient Visits           4 months ago Encounter for general adult medical examination with abnormal findings   [place] [facility] [place], Angeline ORN, NP   11 months ago Hypertriglyceridemia   [place] [facility] [place], Angeline ORN, NP              Signed Prescriptions Disp Refills   Olmesartan -amLODIPine -HCTZ 20-5-12.5 MG TABS 90 tablet 0    Sig: Take 1 tablet by mouth daily.     Cardiovascular: CCB + ARB + Diuretic Combos Passed - 05/04/2024  8:28 AM      Passed - K in normal range and within 180 days    Potassium  Date Value Ref Range Status  12/16/2023 3.8 3.5 - 5.3 mmol/L Final         Passed - Na in normal range and within 180 days    Sodium  Date Value Ref Range Status  12/16/2023 138 135 - 146 mmol/L Final         Passed - Cr in normal range and within 180 days    Creat  Date Value Ref Range Status  12/16/2023 1.11 0.60 - 1.29 mg/dL Final         Passed - eGFR is 10 or above and within 180 days    GFR  Date Value Ref Range Status  07/09/2020 70.68 >60.00 mL/min Final    Comment:    Calculated using the CKD-EPI Creatinine Equation (2021)   eGFR  Date Value Ref Range Status  12/16/2023 81 > OR = 60 mL/min/1.30m2 Final         Passed - Patient is not pregnant      Passed - Last BP in normal range    BP Readings  from Last 1 Encounters:  12/16/23 132/88         Passed - Last Heart Rate in normal range    Pulse Readings from Last 1 Encounters:  12/01/22 98         Passed - Valid encounter within last 6 months    Recent Outpatient Visits           4 months ago Encounter for general adult medical examination with abnormal findings   [place] [facility] [place], Angeline ORN, NP   11 months ago Hypertriglyceridemia   [facility] [place], Angeline ORN, [place]

## 2024-05-09 ENCOUNTER — Telehealth: Payer: Self-pay | Admitting: Internal Medicine

## 2024-05-09 ENCOUNTER — Other Ambulatory Visit: Payer: Self-pay

## 2024-05-09 NOTE — Telephone Encounter (Signed)
 Spoke to patient, he stated he is fine for now with the 15 day supply. Going forward insurance and pharmacy are requesting to do as many 90 supplies of medications.

## 2024-05-09 NOTE — Telephone Encounter (Signed)
 I looked back, it looks like he has only been getting 15 tablets a month for the last 4 months.

## 2024-05-09 NOTE — Telephone Encounter (Signed)
 Copied from CRM #[id number]. Topic: Clinical - Prescription Issue >> May 09, 2024  2:50 PM Pinkey ORN wrote: Reason for CRM: Prescription Issue >> May 09, 2024  2:54 PM Pinkey ORN wrote: Kirk wife called on behalf of patients zolpidem  (AMBIEN ) 10 MG tablet. States they picked up the prescription from the pharmacy but only received 15 out of 15, rather than the 90 day supply patient usually get. Please follow up with [name].

## 2024-06-15 ENCOUNTER — Ambulatory Visit: Admitting: Internal Medicine
